# Patient Record
Sex: Female | Born: 1955
Health system: Southern US, Community
[De-identification: ages and names within clinical notes are randomized; demographics above are authoritative.]

## PROBLEM LIST (undated history)

## (undated) DIAGNOSIS — I5181 Takotsubo syndrome: Secondary | ICD-10-CM

## (undated) DIAGNOSIS — I2510 Atherosclerotic heart disease of native coronary artery without angina pectoris: Secondary | ICD-10-CM

## (undated) DIAGNOSIS — E785 Hyperlipidemia, unspecified: Secondary | ICD-10-CM

## (undated) DIAGNOSIS — I10 Essential (primary) hypertension: Secondary | ICD-10-CM

---

## 1999-08-10 ENCOUNTER — Other Ambulatory Visit: Admission: RE | Admit: 1999-08-10 | Discharge: 1999-08-10 | Payer: Self-pay | Admitting: Family Medicine

## 2001-04-13 ENCOUNTER — Other Ambulatory Visit: Admission: RE | Admit: 2001-04-13 | Discharge: 2001-04-13 | Payer: Self-pay | Admitting: Family Medicine

## 2001-10-19 ENCOUNTER — Encounter: Payer: Self-pay | Admitting: Family Medicine

## 2001-10-19 ENCOUNTER — Encounter: Admission: RE | Admit: 2001-10-19 | Discharge: 2001-10-19 | Payer: Self-pay | Admitting: Family Medicine

## 2002-08-27 ENCOUNTER — Other Ambulatory Visit: Admission: RE | Admit: 2002-08-27 | Discharge: 2002-08-27 | Payer: Self-pay | Admitting: Family Medicine

## 2003-10-14 ENCOUNTER — Encounter: Admission: RE | Admit: 2003-10-14 | Discharge: 2003-10-14 | Payer: Self-pay | Admitting: Family Medicine

## 2004-10-26 ENCOUNTER — Other Ambulatory Visit: Admission: RE | Admit: 2004-10-26 | Discharge: 2004-10-26 | Payer: Self-pay | Admitting: Family Medicine

## 2005-10-28 ENCOUNTER — Other Ambulatory Visit: Admission: RE | Admit: 2005-10-28 | Discharge: 2005-10-28 | Payer: Self-pay | Admitting: Family Medicine

## 2005-12-06 ENCOUNTER — Encounter: Admission: RE | Admit: 2005-12-06 | Discharge: 2005-12-06 | Payer: Self-pay | Admitting: Family Medicine

## 2006-12-27 ENCOUNTER — Other Ambulatory Visit: Admission: RE | Admit: 2006-12-27 | Discharge: 2006-12-27 | Payer: Self-pay | Admitting: Family Medicine

## 2007-01-02 ENCOUNTER — Encounter: Admission: RE | Admit: 2007-01-02 | Discharge: 2007-01-02 | Payer: Self-pay | Admitting: Family Medicine

## 2007-01-12 ENCOUNTER — Ambulatory Visit (HOSPITAL_COMMUNITY): Admission: RE | Admit: 2007-01-12 | Discharge: 2007-01-12 | Payer: Self-pay | Admitting: Neurology

## 2007-04-04 ENCOUNTER — Encounter: Admission: RE | Admit: 2007-04-04 | Discharge: 2007-04-04 | Payer: Self-pay | Admitting: Family Medicine

## 2008-04-23 ENCOUNTER — Other Ambulatory Visit: Admission: RE | Admit: 2008-04-23 | Discharge: 2008-04-23 | Payer: Self-pay | Admitting: Family Medicine

## 2008-05-20 ENCOUNTER — Encounter: Admission: RE | Admit: 2008-05-20 | Discharge: 2008-05-20 | Payer: Self-pay | Admitting: Family Medicine

## 2009-01-23 ENCOUNTER — Inpatient Hospital Stay (HOSPITAL_COMMUNITY): Admission: EM | Admit: 2009-01-23 | Discharge: 2009-01-24 | Payer: Self-pay | Admitting: Emergency Medicine

## 2009-02-05 ENCOUNTER — Emergency Department (HOSPITAL_COMMUNITY): Admission: EM | Admit: 2009-02-05 | Discharge: 2009-02-05 | Payer: Self-pay | Admitting: Emergency Medicine

## 2009-02-10 ENCOUNTER — Encounter: Admission: RE | Admit: 2009-02-10 | Discharge: 2009-02-10 | Payer: Self-pay | Admitting: Interventional Cardiology

## 2009-04-24 ENCOUNTER — Other Ambulatory Visit: Admission: RE | Admit: 2009-04-24 | Discharge: 2009-04-24 | Payer: Self-pay | Admitting: Family Medicine

## 2009-08-14 ENCOUNTER — Encounter: Admission: RE | Admit: 2009-08-14 | Discharge: 2009-08-14 | Payer: Self-pay | Admitting: Family Medicine

## 2009-12-11 ENCOUNTER — Encounter: Admission: RE | Admit: 2009-12-11 | Discharge: 2009-12-11 | Payer: Self-pay | Admitting: Gastroenterology

## 2010-09-07 ENCOUNTER — Other Ambulatory Visit: Admission: RE | Admit: 2010-09-07 | Discharge: 2010-09-07 | Payer: Self-pay | Admitting: Family Medicine

## 2010-12-02 ENCOUNTER — Encounter
Admission: RE | Admit: 2010-12-02 | Discharge: 2010-12-02 | Payer: Self-pay | Source: Home / Self Care | Attending: Family Medicine | Admitting: Family Medicine

## 2010-12-05 ENCOUNTER — Encounter: Payer: Self-pay | Admitting: Neurology

## 2011-02-24 LAB — COMPREHENSIVE METABOLIC PANEL
Creatinine, Ser: 0.72 mg/dL (ref 0.4–1.2)
GFR calc Af Amer: >60 mL/min (ref >60)
GFR calc non Af Amer: >60 mL/min (ref >60)
Glucose, Bld: 97 mg/dL (ref 70–99)
Potassium: 3.6 mEq/L (ref 3.5–5.1)
Total Protein: 7.6 g/dL (ref 6.0–8.3)

## 2011-02-24 LAB — URINALYSIS, ROUTINE W REFLEX MICROSCOPIC
Glucose, UA: NEGATIVE mg/dL
Protein, ur: NEGATIVE mg/dL
Specific Gravity, Urine: 1.018 (ref 1.005–1.030)
Urobilinogen, UA: 0.2 mg/dL (ref 0.0–1.0)
pH: 6 (ref 5.0–8.0)

## 2011-02-24 LAB — URINE CULTURE: Colony Count: 80000

## 2011-02-24 LAB — CBC
HCT: 41 % (ref 36.0–46.0)
Hemoglobin: 14.2 g/dL (ref 12.0–15.0)
MCHC: 33.9 g/dL (ref 30.0–36.0)
MCHC: 34.6 g/dL (ref 30.0–36.0)
Platelets: 297 10*3/uL (ref 150–400)
Platelets: 320 10*3/uL (ref 150–400)
RDW: 13.1 % (ref 11.5–15.5)
RDW: 13.5 % (ref 11.5–15.5)

## 2011-02-24 LAB — CK TOTAL AND CKMB (NOT AT ARMC)
CK, MB: 0.7 ng/mL (ref 0.3–4.0)
Relative Index: INVALID (ref 0.0–2.5)

## 2011-02-24 LAB — POCT CARDIAC MARKERS: CKMB, poc: <1 ng/mL — ABNORMAL LOW (ref 1.0–8.0)

## 2011-02-24 LAB — LIPID PANEL
Cholesterol: 176 mg/dL (ref 0–200)
LDL Cholesterol: 115 mg/dL — ABNORMAL HIGH (ref 0–99)

## 2011-02-24 LAB — CARDIAC PANEL(CRET KIN+CKTOT+MB+TROPI)
Relative Index: INVALID (ref 0.0–2.5)
Total CK: 40 U/L (ref 7–177)
Troponin I: <0.01 ng/mL (ref 0.00–0.06)

## 2011-02-24 LAB — DIFFERENTIAL
Basophils Absolute: 0.1 10*3/uL (ref 0.0–0.1)
Eosinophils Absolute: 0.2 10*3/uL (ref 0.0–0.7)
Eosinophils Relative: 2 % (ref 0–5)
Lymphs Abs: 2.4 10*3/uL (ref 0.7–4.0)
Neutro Abs: 5.7 10*3/uL (ref 1.7–7.7)

## 2011-02-24 LAB — HEMOGLOBIN A1C: Mean Plasma Glucose: 111 mg/dL

## 2011-02-24 LAB — TROPONIN I: Troponin I: <0.01 ng/mL (ref 0.00–0.06)

## 2011-02-24 LAB — MAGNESIUM: Magnesium: 2.2 mg/dL (ref 1.5–2.5)

## 2011-02-24 LAB — BASIC METABOLIC PANEL
BUN: 12 mg/dL (ref 6–23)
CO2: 26 mEq/L (ref 19–32)
Creatinine, Ser: 0.7 mg/dL (ref 0.4–1.2)

## 2011-02-24 LAB — PROTIME-INR: INR: 1 (ref 0.00–1.49)

## 2011-02-24 LAB — TSH: TSH: 2.554 u[IU]/mL (ref 0.350–4.500)

## 2011-03-29 NOTE — Op Note (Signed)
NAME:  Kayla [name], Kayla [name] NO.:  [id number]   MEDICAL RECORD NO.:  [id number]          PATIENT TYPE:  EMS   LOCATION:  MAJO                         FACILITY:  [facility]   PHYSICIAN:  Corky Crafts, MDDATE OF BIRTH:  [birth year]   DATE OF PROCEDURE:  02/05/2009  DATE OF DISCHARGE:  02/05/2009                               OPERATIVE REPORT   REFERRING PHYSICIAN:  Consuella Lose C. Valentina Lucks, MD   PROCEDURES PERFORMED:  Abdominal aortogram, bilateral selective renal  angiogram.   INDICATIONS:  Hypertension, shortness of breath, flank pain, and  abnormal renal Doppler study.   PROCEDURE NARRATIVE:  The risks and benefits of peripheral vascular  angiography were explained to the patient and informed consent was  obtained.  She was brought to the [facility] lab.  She was prepped and draped in  the usual sterile fashion.  Her right groin was infiltrated with 1%  lidocaine.  A 5-French sheath was placed into the right femoral artery  using the modified Seldinger technique.  A pigtail catheter was used for  a power injection of contrast in the abdominal aorta.  Subsequently, a  JR4 catheter was used to engage the renal artery selectively.  Contrast  was injected in several projections.  The sheath was removed using  manual compression.   FINDINGS:  1. Abdominal aortogram:  There are bilateral single renal arteries.      There is no abdominal aortic aneurysm.  There is no obvious      atherosclerosis of the aorta.  2. Selective renal angiography:  The left renal artery is a large      vessel with some tortuosity in the mid portion.  The main renal      artery and all its branches appear widely patent.  3. The right renal artery is a medium to large-sized vessel and also      appears widely patent.   IMPRESSION:  1. No significant renal artery stenosis.  2. No abdominal aortic aneurysm.   RECOMMENDATIONS:  We will continue blood pressure management with  medicines.  She did have a  questionable allergy or reaction to  LISINOPRIL.  It did not appear to be a true allergy; however, we will go  ahead and switch her to Norvasc 5 mg daily.  She will continue  hydrochlorothiazide.  I will follow up with her in the office.  We will  also consider obtaining an abdominal CT scan to evaluate her flank pain.      Corky Crafts, MD  Electronically Signed     JSV/MEDQ  D:  02/05/2009  T:  02/06/2009  Job:  [id number]

## 2011-03-29 NOTE — Discharge Summary (Signed)
NAME:  Kayla [name], Kayla [name] NO.:  [id number]   MEDICAL RECORD NO.:  [id number]          PATIENT TYPE:  INP   LOCATION:  3703                         FACILITY:  [facility]   PHYSICIAN:  Ramiro Harvest, MD    DATE OF BIRTH:  1955/12/27   DATE OF ADMISSION:  01/23/2009  DATE OF DISCHARGE:  01/24/2009                               DISCHARGE SUMMARY   PRIMARY CARE PHYSICIAN:  Dr. Maurice Small of [facility].   DISCHARGE DIAGNOSES:  1. Chest pain.  2. Hypertensive urgency.  3. Anxiety.  4. Irritable bowel syndrome.  5. Gastroesophageal reflux disease.  6. Benign multiple sclerosis.   DISCHARGE MEDICATIONS:  1. HCTZ 12.5 mg p.o. daily.  2. Lisinopril 5 mg p.o. daily.  3. Xanax 0.5 mg p.o. b.i.d. p.r.n.  4. Multivitamin 1 tablet daily.   DISPOSITION AND FOLLOWUP:  The patient will be discharged home.  The  patient to follow up with Dr. Eldridge Dace for outpatient stress test and  outpatient echo test for a further workup of her chest pain.  The  patient is to follow up with PCP in the next 1 - 2 weeks.  The patient  will need a BMET done in the next week to follow up on electrolytes and  renal function as patient is on both HCTZ and has been recently started  on lisinopril.   CONSULTATIONS DONE:  A cardiology consult was done.  The patient was  seen in consultation by Dr. Catalina Gravel on January 23, 2009.   PROCEDURES PERFORMED:  A chest x-ray was performed on January 23, 2009  that was normal chest x-ray.   BRIEF ADMISSION HISTORY AND PHYSICAL:  Kayla [name] is a 55 year old  white female, history of recently diagnosed hypertension, anxiety, IBS,  gastroesophageal reflux disease, benign multiple sclerosis per patient  with family history of coronary artery disease, presented to the ED from  her doctor's office with hypertensive urgency and chest pain.  Per the  patient she was at work on the day of admission, started to feel  lightheaded and dizzy.  Her coworker  took her downstairs to the on-site  clinic where blood pressure was noted to be 198/120.  At the time the  patient started to have some left substernal chest pain, left neck and  left upper extremity pain which she described as a dull pain which  lasted only a few seconds with some associated palpitations, nausea and  feelings of being flushed, intermittent shortness of breath, generalized  fatigue and blurry vision.  The patient denied any fever, no chills, no  diarrhea, no melena, no hematemesis, no hematochezia, no focal  neurological symptoms.  At the on-site clinic the patient was given some  nitroglycerin and aspirin and then sent to the emergency department.  The patient also stated that she had recently been started on HCTZ for  blood pressure 3 days prior to admission per her PCP.  EKG done in the  ED showed a right bundle branch block with some T-wave inversions in  leads III, aVF, V2 through V3.  The patient was  chest pain free at the  time of the interview.  Point of care cardiac markers obtained were  negative.  Comprehensive metabolic profile was within normal limits.  CBC was within normal limits.  Chest x-ray was negative.  We were called  to admit the patient.   PHYSICAL EXAM:  Temperature 98.4, blood pressure 153/92, pulse of 73,  respirations 20, saturating 99% on room air.  GENERAL:  The patient lying on gurney in no apparent distress.  HEENT: Normocephalic/atraumatic, pupils equal, round and reactive to  light and accommodation, extraocular movements intact, oropharynx was  clear, no lesions, no exudates.  NECK:  Supple, no lymphadenopathy.  RESPIRATORY:  Lungs were clear to auscultation bilaterally, no wheezes,  no crackles, no rhonchi.  CARDIOVASCULAR:  Regular rate and rhythm, no murmurs, rubs or gallops.  ABDOMEN:  Soft, nontender, nondistended, positive bowel sounds.  EXTREMITIES:  No clubbing, cyanosis or edema.  NEUROLOGICAL:  The patient was alert and  oriented x3.  Cranial nerves II  through XII were grossly intact.  No focal deficits.   ADMISSION LABS:  CBC:  White count 8.9, hemoglobin 14.2, platelets 320,  hematocrit 41, ANC of 5.7, PTT 30, PT 13, INR 1, sodium 138, potassium  3.6, chloride 101, bicarb 28, BUN 11, creatinine 0.72, glucose of 97,  calcium of 9, albumin of 3.6, protein of 7.6, albumin of 0.3, alk  phosphatase 104, AST 23, ALT 18.  Point of care cardiac markers:  CK-MB  less than 1, troponin-I less than 0.05, myoglobin 43.9.  EKG with a  right bundle branch block and T-wave inversion in leads III, aVF, V2  through V3.   HOSPITAL COURSE:  1. Chest pain.  The patient was admitted with chest pain, rule out      myocardial infarction.  Cardiac enzymes were cycled which came back      negative.  EKG had T-wave inversions in leads III, aVF, V3 and V4.      A TSH obtained was 2.554 which was within normal limits.      Urinalysis was negative.  A hemoglobin A1c was 5.5.  Fasting lipid      panel obtained had a total cholesterol of 176, triglycerides of 62,      HDL of 49, LDL of 115.  The patient was admitted to a tele      monitored bed.  The patient remained asymptomatic throughout the      hospitalization.  Cardiology was consulted secondary to the EKG      changes.  The patient was seen in consultation by Dr. Eldridge Dace of      [facility] on January 23, 2009.  It was felt the patient's      chest pain was likely secondary to her hypertensive urgency.  The      patient was placed on 10 mg of lisinopril as well as 25 mg of HCTZ.      The patient remained stable in stable condition, had no further      episodes of chest pain.  The patient ruled out by cardiac enzymes      and was felt that the patient could follow-up as an outpatient for      outpatient echocardiogram as well as an outpatient stress test per      cardiology.  The patient will have these followed up as an      outpatient with Dr. Eldridge Dace.  The patient  will be discharged in  stable condition.  2. Hypertensive urgency.  The patient was admitted and on admission      the patient was noted to have systolic blood pressures in the 190s.      By the time the patient had come into the emergency room she had      already received some nitroglycerin and aspirin.  The patient's      blood pressure remained in the 150s and then patient was then      placed on HCTZ 25 mg daily as well as 10 mg of lisinopril.  The      patient improved clinically and symptomatically with improvement in      the dizziness and lightheadedness.  The patient's blood pressure      was in the low 100s on the day of discharge, however, the patient      was in stable and improved condition.  The patient will be      discharged home on her home dose of HCTZ 12.5 mg daily as well as 5      mg of lisinopril daily.  The patient is to follow up with primary      care physician in the next 1 - 2 weeks for further assessment of      her blood pressure.  The patient will need a basic metabolic      profile done in 1 week to follow up on electrolytes and renal      function.  The patient had stated that she could get these labs      done at work and sent to her primary care physician.   The rest of the patient's chronic medical issues remained stable  throughout the hospitalization and patient will be discharged in stable  and improved condition.   DISCHARGE VITAL SIGNS:  Temperature 98.1, blood pressure 102/76, pulse  of 67, respirations 16, saturating 96% on room air.   DISCHARGE LABS:  Sodium 136, potassium 3.5, chloride 100, bicarb 26, BUN  12, creatinine 0.7, glucose of 93, calcium of 8.7.  CBC:  White count  6.9, hemoglobin 13.2, platelets 297, hematocrit 39.2, TSH of 2.554.   It was a pleasure taking care of Ms. Daralyn Meckley.      Ramiro Harvest, MD  Electronically Signed     DT/MEDQ  D:  01/24/2009  T:  01/24/2009  Job:  [id number]   cc:   Gretta Arab. Valentina Lucks,  M.D.  Corky Crafts, MD

## 2011-03-29 NOTE — H&P (Signed)
NAME:  Kayla [name], Kayla [name] NO.:  [id number]   MEDICAL RECORD NO.:  [id number]          PATIENT TYPE:  EMS   LOCATION:  MAJO                         FACILITY:  [facility]   PHYSICIAN:  Ramiro Harvest, MD    DATE OF BIRTH:  06/23/1956   DATE OF ADMISSION:  01/23/2009  DATE OF DISCHARGE:                              HISTORY & PHYSICAL   PRIMARY CARE PHYSICIAN:  Dr. Maurice Small of [facility].   HISTORY OF PRESENT ILLNESS:  Kayla [name] is a 52-year white female with  a history of recently diagnosed hypertension, anxiety, IBS, GERD, benign  multiple sclerosis per the patient, and family history of coronary  artery disease who presented to the ED from her doctor's office with  hypertensive urgency and chest pain.  Per the patient, she was at work  and on the day of admission, she started to feel lightheaded and dizzy.  Her  coworker took her downstairs to the on site clinic where her blood  pressure was noted to be 198/120.  The patient at that time was also  started to have some left substernal chest pain, left neck and left  upper extremity pain which she described as a dull pain which only  lasted a few seconds and some associated palpitations, nausea and  feelings of being flushed, intermittent shortness of breath, generalized  fatigue, and blurry vision.  The patient denied any fever.  No chills.  No diarrhea, no melena or hematemesis, no hematochezia.  No focal  neurological symptoms.  At the on site clinic, the patient was given  some nitroglycerin and aspirin and then sent to the ED.  The patient  also stated that she was recently started on HCTZ for her blood pressure  3 days prior to admission per her PCP.  EKG done in the ED showed a  right bundle branch block with some T-wave inversions in leads III, aVF,  V2-V3.  The patient is currently chest pain free.  Point of care cardiac  markers obtained were negative.  Comprehensive metabolic profile was  within  normal limits.  CBC within normal limits.  Chest x-ray was  negative.  We were called to admit the patient.   ALLERGIES:  Percodan causes emesis.   PAST MEDICAL HISTORY:  1. Hypertension.  2. Anxiety.  3. Irritable bowel syndrome.  4. Gastroesophageal reflux disease.  5. Benign multiple sclerosis.   HOME MEDICATIONS:  HCTZ 12.5 mg p.o. daily, Xanax 0.5 mg p.o. b.i.d.  p.r.n., multivitamin daily.   SOCIAL HISTORY:  The patient is married.  No tobacco use.  No alcohol  use.  No IV drug use.   FAMILY HISTORY:  Father alive at age 62, has a history of coronary  artery disease status post CABG and a history of hypertension.  Mother  deceased at age 50 from hepatitis C/cirrhosis and also had a history of  CVA.  The patient has one twin sister who is alive with hypertension.   REVIEW OF SYSTEMS:  Positive for constipation.  Also per HPI.  Otherwise  negative.   PHYSICAL EXAMINATION:  Temperature 98.4, blood pressure 153/92, pulse of  73, respirations 20, satting 99% on room air.  GENERAL:  Patient lying on gurney in no apparent distress.  HEENT:  Normocephalic, atraumatic.  Pupils equal, round and reactive to  light and accommodation.  Extraocular movements intact.  Oropharynx is  clear.  No lesions, no exudates.  NECK:  Supple.  No lymphadenopathy.  RESPIRATORY:  Lungs are clear to auscultation bilaterally.  No wheezes,  no crackles, no rhonchi.  CARDIOVASCULAR:  Regular rate and rhythm.  No murmurs, rubs or gallops.  ABDOMEN:  Soft, nontender, nondistended.  Positive bowel sounds.  EXTREMITIES:  No clubbing, cyanosis or edema.  NEUROLOGICAL:  The patient is alert and oriented x3.  Cranial Nerves II-  XII are grossly intact.  No focal deficits.   ADMISSION LABS:  CBC:  White count 8.9, hemoglobin 14.2, platelets 320,  hematocrit 41.0, ANC of 5.7.  PTT 30, PT 13.0, INR 1.0.  Sodium 138,  potassium 3.6, chloride 101, bicarb 28, BUN 11, creatinine 0.72, glucose  of 97, calcium of  9.2, albumin 3.6, protein 7.6, bilirubin 0.3, alk  phosphatase 104, AST 23, ALT 18.  Point of care cardiac markers:  CK-MB  less than 1.0, troponin I less than 0.05, myoglobin  43.9.  EKG with a  right bundle branch block, T-wave inversion in leads III, aVF, V2-V3.   ASSESSMENT AND PLAN:  Mr. Kayla [name] is a 55 year old female with a  history of recently diagnosed hypertension, anxiety, and  gastroesophageal reflux disease who presents to the ED with hypertensive  urgency, as well as chest pain.  1. Chest pain.  Differential includes acute coronary syndrome versus      GI versus musculoskeletal versus anxiety.  Admit the patient to      telemetry.  The patient does have a family history of coronary      artery disease and also history of hypertension.  EKG with a right      bundle branch block.  No old EKG to compare.  Cycle cardiac enzymes      q.8 h. x3.  Check a TSH.  Check a magnesium.  Check a UA.  Check a      hemoglobin A1c.  Check a BNP.  Check a fasting lipid panel.  Check      a 2-D echo to rule out LV dysfunction.  If cardiac enzymes are      positive, will consult cardiology.  Will place on aspirin, oxygen      and morphine sulfate and blood pressure medications of Norvasc and      HCTZ.  2. Hypertensive urgency secondary to uncontrolled blood pressure.      Will cycle cardiac enzymes.  See problem #1.  Will place on HCTZ 25      mg daily, Norvasc 10 mg daily, and hydralazine as needed.  3. Anxiety.  Ativan as needed.  4. Irritable bowel syndrome.  5. Gastroesophageal reflux disease.  Protonix.  6. Prophylaxis.  Protonix for GI prophylaxis.  Lovenox for DVT      prophylaxis.   It has been a pleasure taking care of [name].      Ramiro Harvest, MD  Electronically Signed     DT/MEDQ  D:  01/23/2009  T:  01/23/2009  Job:  [id number]   cc:   Gretta Arab. Valentina Lucks, M.D.

## 2011-03-29 NOTE — Consult Note (Signed)
NAME:  Kayla [name], Kayla [name] NO.:  [id number]   MEDICAL RECORD NO.:  [id number]          PATIENT TYPE:  INP   LOCATION:  3703                         FACILITY:  [facility]   PHYSICIAN:  Corky Crafts, MDDATE OF BIRTH:  May 19, 1956   DATE OF CONSULTATION:  DATE OF DISCHARGE:                                 CONSULTATION   PRIMARY CARE PHYSICIAN:  Gretta Arab. Valentina Lucks, MD   REFERRING PHYSICIAN:  Ramiro Harvest, MD   CHIEF COMPLAINT:  High blood pressure, chest pain.   HISTORY OF PRESENT ILLNESS:  The patient is a 55 year old woman who was  not feeling well last week.  She had her blood pressure checked at work  and it was in the 160s/90s.  She was given Xanax and told to see her  primary doctor.  She was taking no blood pressure medicines at that  time.  She saw her primary doctor and was given hydrochlorothiazide 12.5  mg a day.  She took this for a few days, but again today felt  lightheaded.  She had her blood pressure checked at work and it was  190/120.  She was sent to the hospital.  While she was waiting for the  ambulance, she had a very mild chest pain that she describes as being on  the surface of her chest, lasting only 1-2 seconds.  There was also a  quick pain in her left arm.  After getting in the ambulance, she  developed the pain in her neck which seems to have persisted.  She was  given nitroglycerin in the ambulance and this seemed to make her feel  somewhat better and did bring her blood pressure down.  Since being on  hydrochlorothiazide, she also reports an occasional sharp left flank  pain.  Since leaving work, she has not had any further chest pain.   PAST MEDICAL HISTORY:  1. Hypertension for 1 week.  2. GERD.   PAST SURGICAL HISTORY:  C-section x2.   MEDICATIONS:  Hydrochlorothiazide, Xanax, estrogen/progesterone cranium,  Claritin p.r.n.   Allergies to Percodan.   SOCIAL HISTORY:  She does not smoke or drink.  She drinks 2 cups of tea  daily.  She works at [employer].  She is married with 2 children.   FAMILY HISTORY:  Significant for father who had bypass surgery at age  62.  Mother died at age 67 with GI issues.  Her sister has high blood  pressure and developed it 1 year ago, sister is a twin sister.  There is  no early coronary artery disease in the family.   REVIEW OF SYSTEMS:  Significant for the above symptoms of high blood  pressure, dizziness, chest pain.  No nausea or vomiting.  No bleeding  problems.  No focal weakness.  No rash.  All other systems negative.   PHYSICAL EXAMINATION:  VITAL SIGNS:  Current blood pressure is 128/85,  pulse of 86.  GENERAL:  She is awake, alert, no apparent distress.  HEAD:  Normocephalic, atraumatic.  Eyes, extraocular movements intact.  NECK:  No JVD.  No carotid bruits.  CARDIOVASCULAR:  Regular rate and rhythm.  S1 and S2.  No murmurs, rubs,  or gallops.  LUNGS:  Clear to auscultation bilaterally.  ABDOMEN:  Soft, nontender, nondistended.  No bruit.  EXTREMITIES:  No edema.  NEUROLOGIC:  No focal deficits.  SKIN:  No rash.  BACK:  No kyphosis.  PSYCHIATRIC:  Normal mood and affect.   EKG shows normal sinus rhythm with a right bundle-branch block.  There  are flat ST-T waves laterally.  There are T-wave inversions inferiorly  in the anterior precordium.  BNP less than 30.  Troponin negative.  Creatinine 0.72.  Hemoglobin of 14.2.  LFTs normal.   ASSESSMENT AND PLAN:  1. Cardiac.  I think her symptoms are all related to the hypertension,      especially the dizziness.  The chest pain is quite atypical.  We      would add lisinopril 10 mg daily for better blood pressure control.      If she rules out and her blood pressure is controlled, I will      consider performing outpatient stress test and also consider a      renal duplex as an outpatient to evaluate for renal artery stenosis      given the sudden onset of this high blood pressure  2. Check cholesterol as part of  routine lipid screening.  3. Gastroesophageal reflux disease.  She had been on Protonix in the      past, but no longer needs this medicine.  What she had today does      not really sound like indigestion.  4. I will follow her while she is in the hospital.      Corky Crafts, MD  Electronically Signed     JSV/MEDQ  D:  01/23/2009  T:  01/24/2009  Job:  [id number]

## 2011-12-16 ENCOUNTER — Other Ambulatory Visit: Payer: Self-pay | Admitting: Family Medicine

## 2011-12-16 DIAGNOSIS — Z1231 Encounter for screening mammogram for malignant neoplasm of breast: Secondary | ICD-10-CM

## 2011-12-28 ENCOUNTER — Ambulatory Visit
Admission: RE | Admit: 2011-12-28 | Discharge: 2011-12-28 | Disposition: A | Discharge status: Home / Self Care | Payer: BC Managed Care – PPO | Source: Ambulatory Visit | Attending: Family Medicine | Admitting: Family Medicine

## 2011-12-28 DIAGNOSIS — Z1231 Encounter for screening mammogram for malignant neoplasm of breast: Secondary | ICD-10-CM

## 2012-01-03 ENCOUNTER — Other Ambulatory Visit: Payer: Self-pay | Admitting: Family Medicine

## 2012-01-03 DIAGNOSIS — R928 Other abnormal and inconclusive findings on diagnostic imaging of breast: Secondary | ICD-10-CM

## 2012-01-10 ENCOUNTER — Ambulatory Visit
Admission: RE | Admit: 2012-01-10 | Discharge: 2012-01-10 | Disposition: A | Discharge status: Home / Self Care | Payer: BC Managed Care – PPO | Source: Ambulatory Visit | Attending: Family Medicine | Admitting: Family Medicine

## 2012-01-10 DIAGNOSIS — R928 Other abnormal and inconclusive findings on diagnostic imaging of breast: Secondary | ICD-10-CM

## 2013-07-12 ENCOUNTER — Ambulatory Visit
Admission: RE | Admit: 2013-07-12 | Discharge: 2013-07-12 | Disposition: A | Discharge status: Home / Self Care | Payer: BC Managed Care – PPO | Source: Ambulatory Visit | Attending: Emergency Medicine | Admitting: Emergency Medicine

## 2013-07-12 ENCOUNTER — Other Ambulatory Visit: Payer: Self-pay | Admitting: Emergency Medicine

## 2013-07-12 DIAGNOSIS — R519 Headache, unspecified: Secondary | ICD-10-CM

## 2013-07-23 ENCOUNTER — Other Ambulatory Visit (HOSPITAL_COMMUNITY)
Admission: RE | Admit: 2013-07-23 | Discharge: 2013-07-23 | Disposition: A | Discharge status: Home / Self Care | Payer: BC Managed Care – PPO | Source: Ambulatory Visit | Attending: Family Medicine | Admitting: Family Medicine

## 2013-07-23 ENCOUNTER — Other Ambulatory Visit: Payer: Self-pay | Admitting: Family Medicine

## 2013-07-23 DIAGNOSIS — Z124 Encounter for screening for malignant neoplasm of cervix: Secondary | ICD-10-CM | POA: Insufficient documentation

## 2014-11-25 ENCOUNTER — Telehealth: Payer: Self-pay | Admitting: *Deleted

## 2014-11-25 NOTE — Telephone Encounter (Signed)
spoke with becky, requested all records.Marland KitchenMarland KitchenMarland Kitchen

## 2014-11-27 ENCOUNTER — Encounter: Payer: Self-pay | Admitting: Interventional Cardiology

## 2014-11-27 ENCOUNTER — Ambulatory Visit (INDEPENDENT_AMBULATORY_CARE_PROVIDER_SITE_OTHER): Payer: BLUE CROSS/BLUE SHIELD | Admitting: Interventional Cardiology

## 2014-11-27 VITALS — BP 130/88 | HR 69 | Ht 60.0 in | Wt 125.0 lb

## 2014-11-27 DIAGNOSIS — I10 Essential (primary) hypertension: Secondary | ICD-10-CM | POA: Insufficient documentation

## 2014-11-27 MED ORDER — METOPROLOL TARTRATE 25 MG PO TABS: 12.5000 mg | ORAL_TABLET | Freq: Two times a day (BID) | ORAL | Status: DC | PRN

## 2014-11-27 NOTE — Patient Instructions (Addendum)
Your physician has recommended you make the following change in your medication:   1) START TAKING METOPROLOL 12.5MG  TWICE DAILY AS NEEDED FOR SYSTOLIC BLOOD PRESSURE GREATER THAN 150   Your physician wants you to follow-up in: 1 YEAR WITH DR. VARANASI. You will receive a reminder letter in the mail two months in advance. If you don't receive a letter, please call our office to schedule the follow-up appointment.

## 2014-11-27 NOTE — Progress Notes (Signed)
Patient ID: Kayla [name], female   DOB: 05/26/1956, 59 y.o.   MRN: [id number]     Patient ID: Kayla [name] MRN: [id number] DOB/AGE: [birth year] 59 y.o.   Referring Physician Dr. Maurice Small   Reason for Consultation BP fluctuation  HPI: 59 y/o who has had HTN and difficulty tolerating several medicines in the past.  I last saw her in 2010. She was worked up for renal artery stenosis at the time. She has had persistent dizziness which is worse in the past few months while she stands.  She also has Multiple sclerosis.   There are occasions when she can have dizziness while sitting as well.  She has not fallen or passed out at any time.  She feels tired.  She does Zumba once a week and walks every other day.  She can keep up well with her Zumba class.   No chest pain or SHOB.  Occasional palpitations, worse when walking in the cold weather. She does not drink coffee due to coffee giving her headaches.  She will eat chocolate, but drinks decaf tea.   Current Outpatient Prescriptions  Medication Sig Dispense Refill  . metoprolol tartrate (LOPRESSOR) 25 MG tablet Take 0.5 tablets (12.5 mg total) by mouth 2 (two) times daily as needed. For systolic BP greater than 150. 30 tablet 6   No current facility-administered medications for this visit.   No past medical history on file.  Family History  Problem Relation Age of Onset  . Heart disease Father     History   Social History  . Marital Status: Married    Spouse Name: N/A    Number of Children: N/A  . Years of Education: N/A   Occupational History  . Not on file.   Social History Main Topics  . Smoking status: Never Smoker   . Smokeless tobacco: Not on file  . Alcohol Use: Not on file  . Drug Use: Not on file  . Sexual Activity: Not on file   Other Topics Concern  . Not on file   Social History Narrative  . No narrative on file    No past surgical history on file.    (Not in a hospital admission)  Review of  systems complete and found to be negative unless listed above .  No nausea, vomiting.  No fever chills, No focal weakness,  No palpitations.  Physical Exam: Filed Vitals:   11/27/14 1441  BP: 130/88  Pulse: 69    Weight: 125 lb (56.7 kg)  Physical exam:  [place]/AT EOMI No JVD, No carotid bruit RRR S1S2  No wheezing Soft. NT, nondistended No edema. No focal motor or sensory deficits Normal affect  Labs:   Lab Results  Component Value Date   WBC 6.9 01/24/2009   HGB 13.2 01/24/2009   HCT 39.0 01/24/2009   MCV 95.3 01/24/2009   PLT 297 01/24/2009   No results for input(s): NA, K, CL, CO2, BUN, CREATININE, CALCIUM, PROT, BILITOT, ALKPHOS, ALT, AST, GLUCOSE in the last 168 hours.  Invalid input(s): LABALBU Lab Results  Component Value Date   CKTOTAL 40 01/24/2009   CKMB 0.5 01/24/2009   TROPONINI <0.01        NO INDICATION OF MYOCARDIAL INJURY. 01/24/2009    Lab Results  Component Value Date   CHOL  01/24/2009    176        ATP III CLASSIFICATION:  <200     mg/dL   Desirable  771-165  mg/dL   Borderline High  >=161    mg/dL   High          Lab Results  Component Value Date   HDL 49 01/24/2009   Lab Results  Component Value Date   LDLCALC * 01/24/2009    115        Total Cholesterol/HDL:CHD Risk Coronary Heart Disease Risk Table                     Men   Women  1/2 Average Risk   3.4   3.3  Average Risk       5.0   4.4  2 X Average Risk   9.6   7.1  3 X Average Risk  23.4   11.0        Use the calculated Patient Ratio above and the CHD Risk Table to determine the patient's CHD Risk.        ATP III CLASSIFICATION (LDL):  <100     mg/dL   Optimal  096-045  mg/dL   Near or Above                    Optimal  130-159  mg/dL   Borderline  409-811  mg/dL   High  >914     mg/dL   Very High   Lab Results  Component Value Date   TRIG 62 01/24/2009   Lab Results  Component Value Date   CHOLHDL 3.6 01/24/2009   No results found for: LDLDIRECT      EKG:NSR, NSST  ASSESSMENT AND PLAN:  1) Episodic hypertension:  She has been on several medications in the past including lisinopril, Micardis and she thinks amlodipine. She had intolerances to several of these medicines. Overall, her blood pressures are well controlled. She has occasional spikes in her blood pressure. The highest reading was 180/100. In the last month, she has not had any further spikes. She does not want to take any daily medicine if possible. This is reasonable since the spikes are quite rare. We will prescribe metoprolol 12.5 mg by mouth twice a day when necessary systolic greater than 160. She has been very sensitive to medicines in the past. She has chronic dizziness. Hopefully, she will not have to use metoprolol very frequently. If she has more blood pressure spikes, she will let us know. Signed:   Fredric Mare, MD, [facility] 11/27/2014, 4:07 PM

## 2014-11-28 NOTE — Telephone Encounter (Signed)
Not sure if records came, i was put back on the floor and chart prep said they had not seen the records.Marland KitchenMarland Kitchen

## 2015-07-10 ENCOUNTER — Other Ambulatory Visit: Payer: Self-pay

## 2015-07-10 DIAGNOSIS — Z1231 Encounter for screening mammogram for malignant neoplasm of breast: Secondary | ICD-10-CM

## 2015-07-13 ENCOUNTER — Ambulatory Visit
Admission: RE | Admit: 2015-07-13 | Discharge: 2015-07-13 | Disposition: A | Discharge status: Home / Self Care | Payer: BLUE CROSS/BLUE SHIELD | Source: Ambulatory Visit

## 2015-07-13 DIAGNOSIS — Z1231 Encounter for screening mammogram for malignant neoplasm of breast: Secondary | ICD-10-CM

## 2015-12-04 ENCOUNTER — Other Ambulatory Visit: Payer: Self-pay | Admitting: Family Medicine

## 2015-12-04 DIAGNOSIS — N950 Postmenopausal bleeding: Secondary | ICD-10-CM

## 2015-12-11 ENCOUNTER — Ambulatory Visit
Admission: RE | Admit: 2015-12-11 | Discharge: 2015-12-11 | Disposition: A | Discharge status: Home / Self Care | Payer: BLUE CROSS/BLUE SHIELD | Source: Ambulatory Visit | Attending: Family Medicine | Admitting: Family Medicine

## 2015-12-11 DIAGNOSIS — N950 Postmenopausal bleeding: Secondary | ICD-10-CM

## 2017-02-24 DIAGNOSIS — Z86010 Personal history of colonic polyps: Secondary | ICD-10-CM | POA: Diagnosis not present

## 2017-02-24 DIAGNOSIS — K640 First degree hemorrhoids: Secondary | ICD-10-CM | POA: Diagnosis not present

## 2018-05-14 DIAGNOSIS — R5383 Other fatigue: Secondary | ICD-10-CM | POA: Diagnosis not present

## 2018-08-24 DIAGNOSIS — Z0000 Encounter for general adult medical examination without abnormal findings: Secondary | ICD-10-CM | POA: Diagnosis not present

## 2018-10-02 ENCOUNTER — Other Ambulatory Visit (HOSPITAL_COMMUNITY)
Admission: RE | Admit: 2018-10-02 | Discharge: 2018-10-02 | Disposition: A | Discharge status: Home / Self Care | Payer: BLUE CROSS/BLUE SHIELD | Source: Ambulatory Visit | Attending: Family Medicine | Admitting: Family Medicine

## 2018-10-02 ENCOUNTER — Other Ambulatory Visit: Payer: Self-pay | Admitting: Physician Assistant

## 2018-10-02 DIAGNOSIS — G35 Multiple sclerosis: Secondary | ICD-10-CM | POA: Diagnosis not present

## 2018-10-02 DIAGNOSIS — Z124 Encounter for screening for malignant neoplasm of cervix: Secondary | ICD-10-CM | POA: Diagnosis not present

## 2018-10-02 DIAGNOSIS — Z01419 Encounter for gynecological examination (general) (routine) without abnormal findings: Secondary | ICD-10-CM | POA: Diagnosis not present

## 2018-10-02 DIAGNOSIS — K219 Gastro-esophageal reflux disease without esophagitis: Secondary | ICD-10-CM | POA: Diagnosis not present

## 2018-10-02 DIAGNOSIS — Z0000 Encounter for general adult medical examination without abnormal findings: Secondary | ICD-10-CM | POA: Diagnosis not present

## 2018-10-02 DIAGNOSIS — I10 Essential (primary) hypertension: Secondary | ICD-10-CM | POA: Diagnosis not present

## 2018-10-04 LAB — CYTOLOGY - PAP: DIAGNOSIS: NEGATIVE

## 2020-02-23 ENCOUNTER — Inpatient Hospital Stay (HOSPITAL_COMMUNITY)
Admission: EM | Admit: 2020-02-23 | Discharge: 2020-02-25 | DRG: 281 | Disposition: A | Discharge status: Home / Self Care | Payer: BC Managed Care – PPO | Attending: Internal Medicine | Admitting: Internal Medicine

## 2020-02-23 ENCOUNTER — Emergency Department (HOSPITAL_COMMUNITY): Payer: BC Managed Care – PPO

## 2020-02-23 ENCOUNTER — Encounter (HOSPITAL_COMMUNITY): Payer: Self-pay | Admitting: Emergency Medicine

## 2020-02-23 ENCOUNTER — Other Ambulatory Visit: Payer: Self-pay

## 2020-02-23 DIAGNOSIS — Z888 Allergy status to other drugs, medicaments and biological substances status: Secondary | ICD-10-CM

## 2020-02-23 DIAGNOSIS — G4489 Other headache syndrome: Secondary | ICD-10-CM | POA: Diagnosis not present

## 2020-02-23 DIAGNOSIS — Z20822 Contact with and (suspected) exposure to covid-19: Secondary | ICD-10-CM | POA: Diagnosis present

## 2020-02-23 DIAGNOSIS — I2510 Atherosclerotic heart disease of native coronary artery without angina pectoris: Secondary | ICD-10-CM | POA: Diagnosis not present

## 2020-02-23 DIAGNOSIS — I509 Heart failure, unspecified: Secondary | ICD-10-CM | POA: Diagnosis not present

## 2020-02-23 DIAGNOSIS — I214 Non-ST elevation (NSTEMI) myocardial infarction: Principal | ICD-10-CM | POA: Diagnosis present

## 2020-02-23 DIAGNOSIS — R52 Pain, unspecified: Secondary | ICD-10-CM | POA: Diagnosis not present

## 2020-02-23 DIAGNOSIS — Z8249 Family history of ischemic heart disease and other diseases of the circulatory system: Secondary | ICD-10-CM

## 2020-02-23 DIAGNOSIS — Z885 Allergy status to narcotic agent status: Secondary | ICD-10-CM

## 2020-02-23 DIAGNOSIS — I4510 Unspecified right bundle-branch block: Secondary | ICD-10-CM | POA: Diagnosis present

## 2020-02-23 DIAGNOSIS — I110 Hypertensive heart disease with heart failure: Secondary | ICD-10-CM | POA: Diagnosis not present

## 2020-02-23 DIAGNOSIS — I252 Old myocardial infarction: Secondary | ICD-10-CM | POA: Diagnosis present

## 2020-02-23 DIAGNOSIS — I25110 Atherosclerotic heart disease of native coronary artery with unstable angina pectoris: Secondary | ICD-10-CM | POA: Diagnosis present

## 2020-02-23 DIAGNOSIS — I5181 Takotsubo syndrome: Secondary | ICD-10-CM | POA: Diagnosis present

## 2020-02-23 DIAGNOSIS — R7989 Other specified abnormal findings of blood chemistry: Secondary | ICD-10-CM | POA: Diagnosis not present

## 2020-02-23 DIAGNOSIS — I6521 Occlusion and stenosis of right carotid artery: Secondary | ICD-10-CM | POA: Diagnosis not present

## 2020-02-23 DIAGNOSIS — E785 Hyperlipidemia, unspecified: Secondary | ICD-10-CM | POA: Diagnosis present

## 2020-02-23 DIAGNOSIS — I10 Essential (primary) hypertension: Secondary | ICD-10-CM | POA: Diagnosis not present

## 2020-02-23 DIAGNOSIS — R0602 Shortness of breath: Secondary | ICD-10-CM | POA: Diagnosis not present

## 2020-02-23 DIAGNOSIS — I519 Heart disease, unspecified: Secondary | ICD-10-CM | POA: Diagnosis not present

## 2020-02-23 HISTORY — DX: Essential (primary) hypertension: I10

## 2020-02-23 HISTORY — DX: Atherosclerotic heart disease of native coronary artery without angina pectoris: I25.10

## 2020-02-23 HISTORY — DX: Takotsubo syndrome: I51.81

## 2020-02-23 HISTORY — DX: Hyperlipidemia, unspecified: E78.5

## 2020-02-23 LAB — TROPONIN I (HIGH SENSITIVITY): Troponin I (High Sensitivity): 372 ng/L (ref <18)

## 2020-02-23 LAB — CBC WITH DIFFERENTIAL/PLATELET
Abs Immature Granulocytes: 0.02 10*3/uL (ref 0.00–0.07)
Basophils Absolute: 0 10*3/uL (ref 0.0–0.1)
Basophils Relative: 1 %
Eosinophils Absolute: 0.3 10*3/uL (ref 0.0–0.5)
Eosinophils Relative: 3 %
HCT: 41.9 % (ref 36.0–46.0)
Hemoglobin: 13.9 g/dL (ref 12.0–15.0)
Immature Granulocytes: 0 %
Lymphocytes Relative: 35 %
Lymphs Abs: 3.1 10*3/uL (ref 0.7–4.0)
MCH: 32.6 pg (ref 26.0–34.0)
MCHC: 33.2 g/dL (ref 30.0–36.0)
MCV: 98.1 fL (ref 80.0–100.0)
Monocytes Absolute: 0.6 10*3/uL (ref 0.1–1.0)
Monocytes Relative: 7 %
Neutro Abs: 4.7 10*3/uL (ref 1.7–7.7)
Neutrophils Relative %: 54 %
Platelets: 351 10*3/uL (ref 150–400)
RBC: 4.27 MIL/uL (ref 3.87–5.11)
RDW: 13.1 % (ref 11.5–15.5)
WBC: 8.8 10*3/uL (ref 4.0–10.5)
nRBC: 0 % (ref 0.0–0.2)

## 2020-02-23 LAB — COMPREHENSIVE METABOLIC PANEL
ALT: 13 U/L (ref 0–44)
AST: 21 U/L (ref 15–41)
Albumin: 3.4 g/dL — ABNORMAL LOW (ref 3.5–5.0)
Alkaline Phosphatase: 71 U/L (ref 38–126)
Anion gap: 10 (ref 5–15)
BUN: 11 mg/dL (ref 8–23)
CO2: 22 mmol/L (ref 22–32)
Calcium: 8.3 mg/dL — ABNORMAL LOW (ref 8.9–10.3)
Chloride: 103 mmol/L (ref 98–111)
Creatinine, Ser: 0.86 mg/dL (ref 0.44–1.00)
GFR calc Af Amer: >60 mL/min (ref >60)
GFR calc non Af Amer: >60 mL/min (ref >60)
Glucose, Bld: 123 mg/dL — ABNORMAL HIGH (ref 70–99)
Potassium: 4 mmol/L (ref 3.5–5.1)
Sodium: 135 mmol/L (ref 135–145)
Total Bilirubin: 0.4 mg/dL (ref 0.3–1.2)
Total Protein: 7.1 g/dL (ref 6.5–8.1)

## 2020-02-23 LAB — D-DIMER, QUANTITATIVE: D-Dimer, Quant: 0.44 ug/mL-FEU (ref 0.00–0.50)

## 2020-02-23 MED ORDER — HEPARIN BOLUS VIA INFUSION
3500.0000 [IU] | Freq: Once | INTRAVENOUS | Status: AC
  Administered 2020-02-23: 3500 [IU] via INTRAVENOUS
  Filled 2020-02-23: qty 3500

## 2020-02-23 MED ORDER — ASPIRIN 81 MG PO CHEW
243.0000 mg | CHEWABLE_TABLET | Freq: Once | ORAL | Status: AC
  Administered 2020-02-23: 243 mg via ORAL
  Filled 2020-02-23: qty 3

## 2020-02-23 MED ORDER — HEPARIN (PORCINE) 25000 UT/250ML-% IV SOLN
850.0000 [IU]/h | INTRAVENOUS | Status: DC
  Administered 2020-02-23: 700 [IU]/h via INTRAVENOUS
  Filled 2020-02-23: qty 250

## 2020-02-23 MED ORDER — ASPIRIN 81 MG PO CHEW: 324.0000 mg | CHEWABLE_TABLET | Freq: Once | ORAL | Status: DC

## 2020-02-23 NOTE — H&P (Signed)
Cardiology Admission History and Physical:   Patient ID: [name] MRN: [id number]; DOB: Jan 27, 1956   Admission date: 02/23/2020  Primary Care Provider: No primary care provider on file. Primary Cardiologist: No primary care provider on file.  Primary Electrophysiologist:  None   Chief Complaint:  Jaw pain, dizziness  Patient Profile:   Kayla [name] is a 64 y.o. female with history of HTN presents with jaw pain.  History of Present Illness:   [name] reports symptoms of jaw pain beginning earlier today. She states that for the past few days she has had shortness of breath with exertion, which is a new symptom for her. She walks about 30 minutes daily and has had increasing shortness of breath during this time lately. Earlier today she describes symptoms of neck, jaw, and head pain that she has never had before. She additionally describes shortness of breath similar to above. She denies any syncope, presyncope, chest pain, chest pressure, nausea, vomiting, loss of vision, change in vision, blurry vision, motor weakness, loss of sensation, leg swelling, weight loss/gain, orthopnea, PND. She does report symptoms of diaphoresis and dizziness. She took aspirin earlier today with some improvement in her neck and head pain. Her symptoms however did not resolve, thus she called EMS and was brought to the ED for evaluation.   On arrival at the ED, the patient continues to report the above symptoms. Her vital signs were significant for systolic blood pressure in the 170s, otherwise the remainder of her vitals were within normal limits. Her laboratory testing was significant for an initial troponin of 372. Her ECG was significant only for RBBB but no acute ST or T wave changes. She was given aspirin and started on a heparin drip. Cardiology was called for consultation.   Currently the patient reports mild symptoms of neck and jaw pain and headache. Her symptoms have slightly improved compared to  earlier in the day.   The patient has a parent who has a history of CAD and underwent CABG later in life. No other significant family history of cardiovascular disease. She is a never smoker. She does not drink alcohol or use illicit drugs.   Heart Pathway Score:     Past Medical History:  Diagnosis Date  . Hypertension        Medications Prior to Admission: Prior to Admission medications   Medication Sig Start Date End Date Taking? Authorizing Provider  metoprolol tartrate (LOPRESSOR) 25 MG tablet Take 0.5 tablets (12.5 mg total) by mouth 2 (two) times daily as needed. For systolic BP greater than 150. 5/53/74   Corky Crafts, MD     Allergies:    Allergies  Allergen Reactions  . Percodan [Oxycodone-Aspirin] Nausea And Vomiting  . Lisinopril Other (See Comments)    unkown    Social History:   Social History   Socioeconomic History  . Marital status: Married    Spouse name: Not on file  . Number of children: Not on file  . Years of education: Not on file  . Highest education level: Not on file  Occupational History  . Not on file  Tobacco Use  . Smoking status: Never Smoker  Substance and Sexual Activity  . Alcohol use: Not on file  . Drug use: Not on file  . Sexual activity: Not on file  Other Topics Concern  . Not on file  Social History Narrative  . Not on file   Social Determinants of Health   Financial Resource  Strain:   . Difficulty of Paying Living Expenses:   Food Insecurity:   . Worried About Programme researcher, broadcasting/film/video in the Last Year:   . Barista in the Last Year:   Transportation Needs:   . Freight forwarder (Medical):   Marland Kitchen Lack of Transportation (Non-Medical):   Physical Activity:   . Days of Exercise per Week:   . Minutes of Exercise per Session:   Stress:   . Feeling of Stress :   Social Connections:   . Frequency of Communication with Friends and Family:   . Frequency of Social Gatherings with Friends and Family:   .  Attends Religious Services:   . Active Member of Clubs or Organizations:   . Attends Banker Meetings:   Marland Kitchen Marital Status:   Intimate Partner Violence:   . Fear of Current or Ex-Partner:   . Emotionally Abused:   Marland Kitchen Physically Abused:   . Sexually Abused:     Family History:   The patient's family history includes Heart disease in her father.    ROS:  Please see the history of present illness.  All other ROS reviewed and negative.     Physical Exam/Data:   Vitals:   02/23/20 2107 02/23/20 2108 02/23/20 2156 02/23/20 2215  BP: (!) 175/97     Pulse: 73  79 64  Resp: 20  20 15   Temp: 97.9 F (36.6 C)     TempSrc: Oral     SpO2: 99%  97% 95%  Weight:  65.8 kg    Height:  4' 11.5" (1.511 m)     No intake or output data in the 24 hours ending 02/23/20 2319 Last 3 Weights 02/23/2020 11/27/2014  Weight (lbs) 145 lb 125 lb  Weight (kg) 65.772 kg 56.7 kg     Body mass index is 28.8 kg/m.  General:  Well nourished, well developed, in no acute distress HEENT: normal Lymph: no adenopathy Neck: no JVD Endocrine:  No thryomegaly Vascular: No carotid bruits; FA pulses 2+ bilaterally without bruits  Cardiac:  normal S1, S2; RRR; no murmur  Lungs:  clear to auscultation bilaterally, no wheezing, rhonchi or rales  Abd: soft, nontender, no hepatomegaly  Ext: no edema Musculoskeletal:  No deformities, BUE and BLE strength normal and equal Skin: warm and dry  Psych:  Normal affect    EKG:  The ECG that was done on arrival to the ED was personally reviewed and demonstrates NSR, RBBB, no significant ST or T wave abnormalities  Relevant CV Studies: No recent CV studies  Laboratory Data:  High Sensitivity Troponin:   Recent Labs  Lab 02/23/20 2125  TROPONINIHS 372*      Chemistry Recent Labs  Lab 02/23/20 2125  NA 135  K 4.0  CL 103  CO2 22  GLUCOSE 123*  BUN 11  CREATININE 0.86  CALCIUM 8.3*  GFRNONAA >60  GFRAA >60  ANIONGAP 10    Recent Labs  Lab  02/23/20 2125  PROT 7.1  ALBUMIN 3.4*  AST 21  ALT 13  ALKPHOS 71  BILITOT 0.4   Hematology Recent Labs  Lab 02/23/20 2125  WBC 8.8  RBC 4.27  HGB 13.9  HCT 41.9  MCV 98.1  MCH 32.6  MCHC 33.2  RDW 13.1  PLT 351   BNPNo results for input(s): BNP, PROBNP in the last 168 hours.  DDimer  Recent Labs  Lab 02/23/20 2125  DDIMER 0.44     Radiology/Studies:  DG Chest 2 View  Result Date: 02/23/2020 CLINICAL DATA:  Shortness of breath, diaphoresis EXAM: CHEST - 2 VIEW COMPARISON:  08/14/2009 FINDINGS: Lungs are clear.  No pleural effusion or pneumothorax. The heart is normal in size. Mild degenerative changes of the visualized thoracolumbar spine. IMPRESSION: Normal chest radiographs. Electronically Signed   By: Charline Bills M.D.   On: 02/23/2020 22:16      {Click here for [place] or NSTEMI  THEN REFRESH NOTE    :[id number]} TIMI Risk Score for Unstable Angina or Non-ST Elevation MI:   The patient's TIMI risk score is 1, which indicates a 5% risk of all cause mortality, new or recurrent myocardial infarction or need for urgent revascularization in the next 14 days.   Assessment and Plan:  Kayla [name] is a 64 y.o. female  with history of HTN presents with jaw pain, found to have elevated troponin.   #) Neck/Head/Jaw pain and elevated troponin: patient's symptoms atypical in nature, however most likely attributable to ACS given associated SOB and elevated troponin. She has no cardiac murmurs that suggest acute aortic syndrome and her carotid exam is normal which argues against carotid dissection. Furthermore her peripheral neurologic exam is normal. It however is a bit unusual that her anginal equivalent would be predominantly R sided neck pain with headache, the differential diagnosis for which of course includes carotid artery dissection. We will proceed with evaluation and treatment for NSTE-ACS at this time, though will also plan to perform CTA of the neck tonight to confirm  that her head/neck symptoms are not due to carotid pathology. Her d-dimer is with normal limits, thus pointing away from PE as a possible etiology for her SOB and neck/jaw pain.  Diagnostics: - repeat troponin  - CTA of neck to rule out carotid artery dissection - echo in AM - check lipids, A1c  Therapeutics: - NPO for cath in AM - ASA 324mg  then 81mg  daily - heparin drip for ACS per pharmacy protocol - start atorvastatin 80mg  QHS - start low dose metoprolol (per patient report she has had issues with alternating hypo/hypertension in past while on beta blockers) - hold ACE for now given 2 contrast loads within 24 hours - SLN, nitro gtt PRN - defer P2Y12 until after cath - cardiac rehab  Severity of Illness: The appropriate patient status for this patient is INPATIENT. Inpatient status is judged to be reasonable and necessary in order to provide the required intensity of service to ensure the patient's safety. The patient's presenting symptoms, physical exam findings, and initial radiographic and laboratory data in the context of their chronic comorbidities is felt to place them at high risk for further clinical deterioration. Furthermore, it is not anticipated that the patient will be medically stable for discharge from the hospital within 2 midnights of admission. The following factors support the patient status of inpatient.   " The patient's presenting symptoms include neck pain. " The worrisome physical exam findings include none. " The initial radiographic and laboratory data are worrisome because of elevated troponin. " The chronic co-morbidities include age.   * I certify that at the point of admission it is my clinical judgment that the patient will require inpatient hospital care spanning beyond 2 midnights from the point of admission due to high intensity of service, high risk for further deterioration and high frequency of surveillance required.*    For questions or updates,  please contact [facility] Please consult [email or web address] for contact info under  Signed, Marcie Mowers, MD  02/23/2020 11:19 PM

## 2020-02-23 NOTE — ED Provider Notes (Signed)
[facility] EMERGENCY DEPARTMENT Provider Note   CSN: [id number] Arrival date & time: 02/23/20  2057     History Chief Complaint  Patient presents with  . Shortness of Breath  . Headache    Kayla [name] is a 64 y.o. female.  64 year old female with history of hypertension who presents with multiple complaints including shortness of breath, sweatiness, and jaw pain.  Earlier today after walking up and down stairs, patient suddenly began having dizziness associated with shortness of breath, diaphoresis, jaw pain, and heart palpitations.  She took a baby aspirin and laid down which seemed to help her symptoms but she began having a headache later.  She denies any chest pain during this episode.  She notes that she has had some shortness of breath with exertion lately but denies any associated chest pain.  She denies any leg swelling/pain, recent travel, history of blood clots, history of cancer, or estrogen use. No cough/cold sx or fevers.  No tobacco use. FH notable for parent w/ heart disease/CABG.  The history is provided by the patient.  Shortness of Breath Associated symptoms: headaches   Headache      Past Medical History:  Diagnosis Date  . Hypertension     Patient Active Problem List   Diagnosis Date Noted  . NSTEMI (non-ST elevated myocardial infarction) (HCC) 02/23/2020  . Essential hypertension 11/27/2014    ** The histories are not reviewed yet. Please review them in the "History" navigator section and refresh this SmartLink.   OB History   No obstetric history on file.     Family History  Problem Relation Age of Onset  . Heart disease Father     Social History   Tobacco Use  . Smoking status: Never Smoker  Substance Use Topics  . Alcohol use: Not on file  . Drug use: Not on file    Home Medications Prior to Admission medications   Medication Sig Start Date End Date Taking? Authorizing Provider  metoprolol tartrate  (LOPRESSOR) 25 MG tablet Take 0.5 tablets (12.5 mg total) by mouth 2 (two) times daily as needed. For systolic BP greater than 150. 8/41/32   Corky Crafts, MD    Allergies    Percodan [oxycodone-aspirin] and Lisinopril  Review of Systems   Review of Systems  Respiratory: Positive for shortness of breath.   Neurological: Positive for headaches.   All other systems reviewed and are negative except that which was mentioned in HPI  Physical Exam Updated Vital Signs BP (!) 175/97 (BP Location: Right Arm)   Pulse 64   Temp 97.9 F (36.6 C) (Oral)   Resp 15   Ht 4' 11.5" (1.511 m)   Wt 65.8 kg   SpO2 95%   BMI 28.80 kg/m   Physical Exam Vitals and nursing note reviewed.  Constitutional:      General: She is not in acute distress.    Appearance: She is well-developed.  HENT:     Head: Normocephalic and atraumatic.  Eyes:     Conjunctiva/sclera: Conjunctivae normal.  Cardiovascular:     Rate and Rhythm: Normal rate and regular rhythm.     Heart sounds: Normal heart sounds. No murmur.  Pulmonary:     Effort: Pulmonary effort is normal.     Breath sounds: Normal breath sounds.  Abdominal:     General: Bowel sounds are normal. There is no distension.     Palpations: Abdomen is soft.     Tenderness: There is  no abdominal tenderness.  Musculoskeletal:     Cervical back: Neck supple.  Skin:    General: Skin is warm and dry.  Neurological:     Mental Status: She is alert and oriented to person, place, and time.     Comments: Fluent speech  Psychiatric:        Judgment: Judgment normal.     ED Results / Procedures / Treatments   Labs (all labs ordered are listed, but only abnormal results are displayed) Labs Reviewed  COMPREHENSIVE METABOLIC PANEL - Abnormal; Notable for the following components:      Result Value   Glucose, Bld 123 (*)    Calcium 8.3 (*)    Albumin 3.4 (*)    All other components within normal limits  TROPONIN I (HIGH SENSITIVITY) -  Abnormal; Notable for the following components:   Troponin I (High Sensitivity) 372 (*)    All other components within normal limits  SARS CORONAVIRUS 2 (TAT 6-24 HRS)  D-DIMER, QUANTITATIVE (NOT AT [facility])  CBC WITH DIFFERENTIAL/PLATELET  HEPARIN LEVEL (UNFRACTIONATED)  CBC  TROPONIN I (HIGH SENSITIVITY)    EKG EKG Interpretation  Date/Time:  Sunday February 23 2020 21:08:43 EDT Ventricular Rate:  74 PR Interval:    QRS Duration: 121 QT Interval:  423 QTC Calculation: 470 R Axis:   30 Text Interpretation: Sinus rhythm Right bundle branch block similar to previous Confirmed by Frederick Peers [phone]) on 02/23/2020 10:16:48 PM   Radiology DG Chest 2 View  Result Date: 02/23/2020 CLINICAL DATA:  Shortness of breath, diaphoresis EXAM: CHEST - 2 VIEW COMPARISON:  08/14/2009 FINDINGS: Lungs are clear.  No pleural effusion or pneumothorax. The heart is normal in size. Mild degenerative changes of the visualized thoracolumbar spine. IMPRESSION: Normal chest radiographs. Electronically Signed   By: Charline Bills M.D.   On: 02/23/2020 22:16    Procedures Procedures (including critical care time) CRITICAL CARE Performed by: Ambrose Finland Ramiyah Mcclenahan   Total critical care time: 30 minutes  Critical care time was exclusive of separately billable procedures and treating other patients.  Critical care was necessary to treat or prevent imminent or life-threatening deterioration.  Critical care was time spent personally by me on the following activities: development of treatment plan with patient and/or surrogate as well as nursing, discussions with consultants, evaluation of patient's response to treatment, examination of patient, obtaining history from patient or surrogate, ordering and performing treatments and interventions, ordering and review of laboratory studies, ordering and review of radiographic studies, pulse oximetry and re-evaluation of patient's condition.  Medications Ordered in  ED Medications  heparin bolus via infusion 3,500 Units (has no administration in time range)  heparin ADULT infusion 100 units/mL (25000 units/224mL sodium chloride 0.45%) (has no administration in time range)  aspirin chewable tablet 243 mg (243 mg Oral Given 02/23/20 2238)    ED Course  I have reviewed the triage vital signs and the nursing notes.  Pertinent labs & imaging results that were available during my care of the patient were reviewed by me and considered in my medical decision making (see chart for details).    MDM Rules/Calculators/A&P                      Pt comfortable on exam, hypertensive. EKG similar to previous. Labs notable for trop 372. D-dimer negative. Gave ASA, started heparin drip, and discussed w/ cardiology, Alinda Money, who will admit for further care. Final Clinical Impression(s) / ED Diagnoses Final diagnoses:  NSTEMI (  non-ST elevated myocardial infarction) [facility])    Rx / [place] Orders ED Discharge Orders    None       Donis Pinder, Wenda Overland, MD 02/23/20 [phone]

## 2020-02-23 NOTE — ED Notes (Signed)
EDP notified of positive trop.  Cardiologist bedside at this time.

## 2020-02-23 NOTE — Progress Notes (Signed)
ANTICOAGULATION CONSULT NOTE - Initial Consult  Pharmacy Consult for heparin  Indication: chest pain/ACS  Allergies  Allergen Reactions  . Percodan [Oxycodone-Aspirin] Nausea And Vomiting  . Lisinopril Other (See Comments)    unkown    Patient Measurements: Height: 4' 11.5" (151.1 cm) Weight: 65.8 kg (145 lb) IBW/kg (Calculated) : 44.35 Heparin Dosing Weight: 58.5 kg  Vital Signs: Temp: 97.9 F (36.6 C) (04/11 2107) Temp Source: Oral (04/11 2107) BP: 175/97 (04/11 2107) Pulse Rate: 64 (04/11 2215)  Labs: Recent Labs    02/23/20 2125  HGB 13.9  HCT 41.9  PLT 351  CREATININE 0.86  TROPONINIHS 372*    Estimated Creatinine Clearance: 55.3 mL/min (by C-G formula based on SCr of 0.86 mg/dL).   Medical History: No past medical history on file.  Assessment: 50 yof presenting with jaw pain, diaphoresis, SOB, and palpations. No AC listed PTA.  Hgb 13.9, plt 351. D-dimer 0.44, trop 372. No s/sx of bleeding.   Goal of Therapy:  Heparin level 0.3-0.7 units/ml Monitor platelets by anticoagulation protocol: Yes   Plan:  Give 3500 units bolus x 1 Start heparin infusion at 700 units/hr Check anti-Xa level in 6 hours and daily while on heparin Continue to monitor H&H and platelets  Sherron Monday, PharmD, BCCCP Clinical Pharmacist  Phone: [phone] 02/23/2020 10:46 PM  Please check AMION for all [facility] Pharmacy phone numbers After 10:00 PM, call Main Pharmacy [phone]

## 2020-02-23 NOTE — ED Notes (Signed)
Pt to DG. 

## 2020-02-23 NOTE — ED Triage Notes (Signed)
Per EMS, pt from home reports earlier today she had sudden onset of dizziness, SOB, diaphoresis, jaw pain, and palpations upon ambulation.  Pt states she took a baby ASA and the layed down.   When she got up she found she had a bad headache.  No cardiac hx, only hx is MS.

## 2020-02-24 ENCOUNTER — Inpatient Hospital Stay (HOSPITAL_COMMUNITY): Payer: BC Managed Care – PPO

## 2020-02-24 ENCOUNTER — Encounter (HOSPITAL_COMMUNITY)
Admission: EM | Disposition: A | Discharge status: Home / Self Care | Payer: Self-pay | Source: Home / Self Care | Attending: Internal Medicine

## 2020-02-24 DIAGNOSIS — I10 Essential (primary) hypertension: Secondary | ICD-10-CM

## 2020-02-24 DIAGNOSIS — I2510 Atherosclerotic heart disease of native coronary artery without angina pectoris: Secondary | ICD-10-CM | POA: Diagnosis not present

## 2020-02-24 DIAGNOSIS — I519 Heart disease, unspecified: Secondary | ICD-10-CM | POA: Diagnosis not present

## 2020-02-24 DIAGNOSIS — I25110 Atherosclerotic heart disease of native coronary artery with unstable angina pectoris: Secondary | ICD-10-CM | POA: Diagnosis not present

## 2020-02-24 DIAGNOSIS — I6521 Occlusion and stenosis of right carotid artery: Secondary | ICD-10-CM | POA: Diagnosis not present

## 2020-02-24 DIAGNOSIS — R0602 Shortness of breath: Secondary | ICD-10-CM | POA: Diagnosis not present

## 2020-02-24 DIAGNOSIS — Z20822 Contact with and (suspected) exposure to covid-19: Secondary | ICD-10-CM | POA: Diagnosis not present

## 2020-02-24 DIAGNOSIS — I214 Non-ST elevation (NSTEMI) myocardial infarction: Secondary | ICD-10-CM

## 2020-02-24 DIAGNOSIS — I5181 Takotsubo syndrome: Secondary | ICD-10-CM | POA: Diagnosis not present

## 2020-02-24 HISTORY — PX: LEFT HEART CATH AND CORONARY ANGIOGRAPHY: CATH118249

## 2020-02-24 LAB — CBC
HCT: 41 % (ref 36.0–46.0)
Hemoglobin: 13.4 g/dL (ref 12.0–15.0)
MCH: 31.3 pg (ref 26.0–34.0)
MCHC: 32.7 g/dL (ref 30.0–36.0)
MCV: 95.8 fL (ref 80.0–100.0)
Platelets: 359 10*3/uL (ref 150–400)
RBC: 4.28 MIL/uL (ref 3.87–5.11)
RDW: 12.9 % (ref 11.5–15.5)
WBC: 10.9 10*3/uL — ABNORMAL HIGH (ref 4.0–10.5)
nRBC: 0 % (ref 0.0–0.2)

## 2020-02-24 LAB — LIPID PANEL
Cholesterol: 168 mg/dL (ref 0–200)
HDL: 52 mg/dL (ref >40)
LDL Cholesterol: 99 mg/dL (ref 0–99)
Total CHOL/HDL Ratio: 3.2 RATIO
Triglycerides: 84 mg/dL (ref <150)
VLDL: 17 mg/dL (ref 0–40)

## 2020-02-24 LAB — TROPONIN I (HIGH SENSITIVITY): Troponin I (High Sensitivity): 1869 ng/L (ref <18)

## 2020-02-24 LAB — HEPARIN LEVEL (UNFRACTIONATED)
Heparin Unfractionated: 0.87 IU/mL — ABNORMAL HIGH (ref 0.30–0.70)
Heparin Unfractionated: 0.16 IU/mL — ABNORMAL LOW (ref 0.30–0.70)

## 2020-02-24 LAB — BASIC METABOLIC PANEL
Anion gap: 10 (ref 5–15)
BUN: 9 mg/dL (ref 8–23)
CO2: 22 mmol/L (ref 22–32)
Calcium: 8.5 mg/dL — ABNORMAL LOW (ref 8.9–10.3)
Chloride: 107 mmol/L (ref 98–111)
Creatinine, Ser: 0.73 mg/dL (ref 0.44–1.00)
GFR calc Af Amer: >60 mL/min (ref >60)
GFR calc non Af Amer: >60 mL/min (ref >60)
Glucose, Bld: 108 mg/dL — ABNORMAL HIGH (ref 70–99)
Potassium: 4.2 mmol/L (ref 3.5–5.1)
Sodium: 139 mmol/L (ref 135–145)

## 2020-02-24 LAB — ECHOCARDIOGRAM COMPLETE
Height: 59.5 in
Weight: 2338.64 oz

## 2020-02-24 LAB — PROTIME-INR
INR: 1 (ref 0.8–1.2)
Prothrombin Time: 12.9 seconds (ref 11.4–15.2)

## 2020-02-24 LAB — HIV ANTIBODY (ROUTINE TESTING W REFLEX): HIV Screen 4th Generation wRfx: NONREACTIVE

## 2020-02-24 LAB — MRSA PCR SCREENING: MRSA by PCR: NEGATIVE

## 2020-02-24 LAB — SARS CORONAVIRUS 2 (TAT 6-24 HRS): SARS Coronavirus 2: NEGATIVE

## 2020-02-24 SURGERY — LEFT HEART CATH AND CORONARY ANGIOGRAPHY
Anesthesia: LOCAL

## 2020-02-24 MED ORDER — VERAPAMIL HCL 2.5 MG/ML IV SOLN
INTRAVENOUS | Status: DC | PRN
  Administered 2020-02-24: 17:00:00 10 mL via INTRA_ARTERIAL

## 2020-02-24 MED ORDER — ATORVASTATIN CALCIUM 80 MG PO TABS
80.0000 mg | ORAL_TABLET | Freq: Every day | ORAL | Status: DC
  Administered 2020-02-24 (×2): 80 mg via ORAL
  Filled 2020-02-24 (×2): qty 1

## 2020-02-24 MED ORDER — NITROGLYCERIN 0.4 MG SL SUBL: 0.4000 mg | SUBLINGUAL_TABLET | SUBLINGUAL | Status: DC | PRN

## 2020-02-24 MED ORDER — HYDRALAZINE HCL 25 MG PO TABS: 25.0000 mg | ORAL_TABLET | Freq: Four times a day (QID) | ORAL | Status: DC | PRN

## 2020-02-24 MED ORDER — NITROGLYCERIN 1 MG/10 ML FOR IR/CATH LAB
INTRA_ARTERIAL | Status: AC
  Filled 2020-02-24: qty 10

## 2020-02-24 MED ORDER — SODIUM CHLORIDE 0.9 % WEIGHT BASED INFUSION
1.0000 mL/kg/h | INTRAVENOUS | Status: DC
  Administered 2020-02-24: 1 mL/kg/h via INTRAVENOUS

## 2020-02-24 MED ORDER — MIDAZOLAM HCL 2 MG/2ML IJ SOLN
INTRAMUSCULAR | Status: DC | PRN
  Administered 2020-02-24 (×2): 1 mg via INTRAVENOUS

## 2020-02-24 MED ORDER — FENTANYL CITRATE (PF) 100 MCG/2ML IJ SOLN
INTRAMUSCULAR | Status: AC
  Filled 2020-02-24: qty 2

## 2020-02-24 MED ORDER — IOHEXOL 350 MG/ML SOLN
INTRAVENOUS | Status: DC | PRN
  Administered 2020-02-24: 40 mL via INTRA_ARTERIAL

## 2020-02-24 MED ORDER — VERAPAMIL HCL 2.5 MG/ML IV SOLN
INTRAVENOUS | Status: AC
  Filled 2020-02-24: qty 2

## 2020-02-24 MED ORDER — HYDRALAZINE HCL 20 MG/ML IJ SOLN: 10.0000 mg | INTRAMUSCULAR | Status: AC | PRN

## 2020-02-24 MED ORDER — SODIUM CHLORIDE 0.9% FLUSH: 3.0000 mL | INTRAVENOUS | Status: DC | PRN

## 2020-02-24 MED ORDER — IOHEXOL 350 MG/ML SOLN
80.0000 mL | Freq: Once | INTRAVENOUS | Status: AC | PRN
  Administered 2020-02-24: 80 mL via INTRAVENOUS

## 2020-02-24 MED ORDER — HEPARIN SODIUM (PORCINE) 1000 UNIT/ML IJ SOLN
INTRAMUSCULAR | Status: DC | PRN
  Administered 2020-02-24: 3500 [IU] via INTRAVENOUS

## 2020-02-24 MED ORDER — ONDANSETRON HCL 4 MG/2ML IJ SOLN: 4.0000 mg | Freq: Four times a day (QID) | INTRAMUSCULAR | Status: DC | PRN

## 2020-02-24 MED ORDER — NITROGLYCERIN 1 MG/10 ML FOR IR/CATH LAB
INTRA_ARTERIAL | Status: DC | PRN
  Administered 2020-02-24: 200 ug via INTRACORONARY

## 2020-02-24 MED ORDER — FENTANYL CITRATE (PF) 100 MCG/2ML IJ SOLN
INTRAMUSCULAR | Status: DC | PRN
  Administered 2020-02-24: 50 ug via INTRAVENOUS
  Administered 2020-02-24: 25 ug via INTRAVENOUS

## 2020-02-24 MED ORDER — MIDAZOLAM HCL 2 MG/2ML IJ SOLN
INTRAMUSCULAR | Status: AC
  Filled 2020-02-24: qty 2

## 2020-02-24 MED ORDER — SODIUM CHLORIDE 0.9 % IV SOLN: 250.0000 mL | INTRAVENOUS | Status: DC | PRN

## 2020-02-24 MED ORDER — SODIUM CHLORIDE 0.9 % WEIGHT BASED INFUSION
3.0000 mL/kg/h | INTRAVENOUS | Status: DC
  Administered 2020-02-24: 3 mL/kg/h via INTRAVENOUS

## 2020-02-24 MED ORDER — HEPARIN BOLUS VIA INFUSION
1500.0000 [IU] | Freq: Once | INTRAVENOUS | Status: AC
  Administered 2020-02-24: 1500 [IU] via INTRAVENOUS
  Filled 2020-02-24: qty 1500

## 2020-02-24 MED ORDER — HEPARIN (PORCINE) IN NACL 1000-0.9 UT/500ML-% IV SOLN
INTRAVENOUS | Status: DC | PRN
  Administered 2020-02-24 (×2): 500 mL

## 2020-02-24 MED ORDER — LIDOCAINE HCL (PF) 1 % IJ SOLN
INTRAMUSCULAR | Status: DC | PRN
  Administered 2020-02-24: 2 mL

## 2020-02-24 MED ORDER — SODIUM CHLORIDE 0.9% FLUSH
3.0000 mL | Freq: Two times a day (BID) | INTRAVENOUS | Status: DC
  Administered 2020-02-24: 3 mL via INTRAVENOUS

## 2020-02-24 MED ORDER — METOPROLOL SUCCINATE ER 25 MG PO TB24
12.5000 mg | ORAL_TABLET | Freq: Every day | ORAL | Status: DC
  Administered 2020-02-24 – 2020-02-25 (×2): 12.5 mg via ORAL
  Filled 2020-02-24 (×3): qty 1

## 2020-02-24 MED ORDER — ASPIRIN EC 81 MG PO TBEC
81.0000 mg | DELAYED_RELEASE_TABLET | Freq: Every day | ORAL | Status: DC
  Administered 2020-02-24 – 2020-02-25 (×2): 81 mg via ORAL
  Filled 2020-02-24 (×2): qty 1

## 2020-02-24 MED ORDER — ACETAMINOPHEN 325 MG PO TABS: 650.0000 mg | ORAL_TABLET | ORAL | Status: DC | PRN

## 2020-02-24 MED ORDER — LIDOCAINE HCL (PF) 1 % IJ SOLN
INTRAMUSCULAR | Status: AC
  Filled 2020-02-24: qty 30

## 2020-02-24 MED ORDER — ZOLPIDEM TARTRATE 5 MG PO TABS
5.0000 mg | ORAL_TABLET | Freq: Every evening | ORAL | Status: DC | PRN
  Administered 2020-02-24: 5 mg via ORAL
  Filled 2020-02-24: qty 1

## 2020-02-24 MED ORDER — HEPARIN (PORCINE) IN NACL 1000-0.9 UT/500ML-% IV SOLN
INTRAVENOUS | Status: AC
  Filled 2020-02-24: qty 1000

## 2020-02-24 MED ORDER — ENOXAPARIN SODIUM 40 MG/0.4ML ~~LOC~~ SOLN
40.0000 mg | SUBCUTANEOUS | Status: DC
  Filled 2020-02-24: qty 0.4

## 2020-02-24 SURGICAL SUPPLY — 12 items
CATH 5FR JL3.5 JR4 ANG PIG MP (CATHETERS) ×1 IMPLANT
CATH INFINITI 4FR 145 PIGTAIL (CATHETERS) ×1 IMPLANT
CATH INFINITI 4FR JL3.5 (CATHETERS) ×1 IMPLANT
DEVICE RAD COMP TR BAND LRG (VASCULAR PRODUCTS) ×1 IMPLANT
GLIDESHEATH SLEND SS 6F .021 (SHEATH) ×1 IMPLANT
GUIDEWIRE INQWIRE 1.5J.035X260 (WIRE) IMPLANT
INQWIRE 1.5J .035X260CM (WIRE) ×2
KIT HEART LEFT (KITS) ×2 IMPLANT
PACK CARDIAC CATHETERIZATION (CUSTOM PROCEDURE TRAY) ×2 IMPLANT
SYR MEDRAD MARK 7 150ML (SYRINGE) ×2 IMPLANT
TRANSDUCER W/STOPCOCK (MISCELLANEOUS) ×2 IMPLANT
TUBING CIL FLEX 10 FLL-RA (TUBING) ×2 IMPLANT

## 2020-02-24 NOTE — Progress Notes (Signed)
ANTICOAGULATION CONSULT NOTE  Pharmacy Consult for heparin  Indication: chest pain/ACS  Allergies  Allergen Reactions  . Beta Adrenergic Blockers Shortness Of Breath    Unnamed beta blocker (name not recalled by the patient) = shortness of breath  . Percodan [Oxycodone-Aspirin] Nausea And Vomiting  . Lisinopril Other (See Comments)    Patient does not recall the reaction  . Pork-Derived Products Other (See Comments)    Religious reasons = No pork  . Nickel Itching and Rash    Patient Measurements: Height: 4' 11.5" (151.1 cm) Weight: 66.3 kg (146 lb 2.6 oz) IBW/kg (Calculated) : 44.35 Heparin Dosing Weight: 58.5 kg  Vital Signs: Temp: 97.8 F (36.6 C) (04/12 0829) Temp Source: Oral (04/12 0829) BP: 145/98 (04/12 0829) Pulse Rate: 71 (04/12 0829)  Labs: Recent Labs    02/23/20 2125 02/24/20 0106 02/24/20 0819  HGB 13.9 13.4  --   HCT 41.9 41.0  --   PLT 351 359  --   LABPROT  --  12.9  --   INR  --  1.0  --   HEPARINUNFRC  --  0.87* 0.16*  CREATININE 0.86 0.73  --   TROPONINIHS 372* 1,869*  --     Estimated Creatinine Clearance: 59.7 mL/min (by C-G formula based on SCr of 0.73 mg/dL).   Medical History: Past Medical History:  Diagnosis Date  . Hypertension     Assessment: 48 yof presenting with jaw pain, diaphoresis, SOB, and palpations. No AC listed PTA. Pharmacy dosing heparin for NSTEMI. Plans for cath today   Goal of Therapy:  Heparin level 0.3-0.7 units/ml Monitor platelets by anticoagulation protocol: Yes   Plan:  -heparin bolus 1500 units then increase infusion to 850 units/hr -Will follow plans post cath  Harland German, PharmD Clinical Pharmacist **Pharmacist phone directory can now be found on [email or web address] ([email or web address]).  Listed under [facility].

## 2020-02-24 NOTE — Plan of Care (Signed)
Plan of Care

## 2020-02-24 NOTE — ED Notes (Signed)
Attempted to call report

## 2020-02-24 NOTE — Progress Notes (Addendum)
Progress Note  Patient Name: Kayla [name] Date of Encounter: 02/24/2020  Primary Cardiologist: New to Dr. Allyson Sabal   Subjective   Feeling well. No chest pain, sob or palpitations.   Inpatient Medications    Scheduled Meds: . aspirin EC  81 mg Oral Daily  . atorvastatin  80 mg Oral q1800  . metoprolol succinate  12.5 mg Oral Daily   Continuous Infusions: . heparin 700 Units/hr (02/23/20 2358)   PRN Meds: acetaminophen, nitroGLYCERIN, ondansetron (ZOFRAN) IV   Vital Signs    Vitals:   02/24/20 0001 02/24/20 0015 02/24/20 0100 02/24/20 0347  BP: (!) 153/100 (!) 168/101 (!) 149/66 (!) 146/93  Pulse: 72 72 76 75  Resp: 15 15  16   Temp:   98.2 F (36.8 C) 98.2 F (36.8 C)  TempSrc:   Oral Oral  SpO2: 96% 98% 97% 98%  Weight:   66.3 kg   Height:   4' 11.5" (1.511 m)     Intake/Output Summary (Last 24 hours) at 02/24/2020 0758 Last data filed at 02/24/2020 0400 Gross per 24 hour  Intake 0 ml  Output --  Net 0 ml   Last 3 Weights 02/24/2020 02/23/2020 11/27/2014  Weight (lbs) 146 lb 2.6 oz 145 lb 125 lb  Weight (kg) 66.3 kg 65.772 kg 56.7 kg      Telemetry    NSR - Personally Reviewed  ECG    NSR with RBBB - Personally Reviewed  Physical Exam   GEN: No acute distress.   Neck: No JVD Cardiac: RRR, no murmurs, rubs, or gallops.  Respiratory: Clear to auscultation bilaterally. GI: Soft, nontender, non-distended  MS: No edema; No deformity. Neuro:  Nonfocal  Psych: Normal affect   Labs    High Sensitivity Troponin:   Recent Labs  Lab 02/23/20 2125 02/24/20 0106  TROPONINIHS 372* 1,869*      Chemistry Recent Labs  Lab 02/23/20 2125 02/24/20 0106  NA 135 139  K 4.0 4.2  CL 103 107  CO2 22 22  GLUCOSE 123* 108*  BUN 11 9  CREATININE 0.86 0.73  CALCIUM 8.3* 8.5*  PROT 7.1  --   ALBUMIN 3.4*  --   AST 21  --   ALT 13  --   ALKPHOS 71  --   BILITOT 0.4  --   GFRNONAA >60 >60  GFRAA >60 >60  ANIONGAP 10 10     Hematology Recent Labs   Lab 02/23/20 2125 02/24/20 0106  WBC 8.8 10.9*  RBC 4.27 4.28  HGB 13.9 13.4  HCT 41.9 41.0  MCV 98.1 95.8  MCH 32.6 31.3  MCHC 33.2 32.7  RDW 13.1 12.9  PLT 351 359    BNPNo results for input(s): BNP, PROBNP in the last 168 hours.   DDimer  Recent Labs  Lab 02/23/20 2125  DDIMER 0.44     Radiology    CT Angio Head W or Wo Contrast  Result Date: 02/24/2020 CLINICAL DATA:  Initial evaluation for acute neck and jaw pain, elevated troponin. EXAM: CT ANGIOGRAPHY HEAD AND NECK TECHNIQUE: Multidetector CT imaging of the head and neck was performed using the standard protocol during bolus administration of intravenous contrast. Multiplanar CT image reconstructions and MIPs were obtained to evaluate the vascular anatomy. Carotid stenosis measurements (when applicable) are obtained utilizing NASCET criteria, using the distal internal carotid diameter as the denominator. CONTRAST:  38mL OMNIPAQUE IOHEXOL 350 MG/ML SOLN COMPARISON:  Prior head CT from 06/11/2013. FINDINGS: CT HEAD FINDINGS Brain: Cerebral  volume within normal limits. Few scattered hypodense lesions seen involving the periventricular and deep white matter both cerebral hemispheres, consistent with history of demyelinating disease, similar to previous. No acute intracranial hemorrhage. No acute large vessel territory infarct. No mass lesion, midline shift or mass effect. No hydrocephalus or extra-axial fluid collection. Vascular: No hyperdense vessel. Skull: Scalp soft tissues and calvarium within normal limits. Sinuses: Paranasal sinuses are clear.  No mastoid effusion. Orbits: Globes and orbital soft tissues within normal limits. Review of the MIP images confirms the above findings CTA NECK FINDINGS Aortic arch: Visualized aortic arch of normal caliber with normal 3 vessel morphology. No hemodynamically significant stenosis seen about the origin of the great vessels. Partially visualized subclavian arteries widely patent. Right  carotid system: Right common carotid artery patent from its origin to the bifurcation without stenosis or other abnormality. Short-segment atheromatous stenosis of up to 35% at the origin of the right ICA. Right ICA widely patent distally to the skull base without stenosis, dissection or occlusion. Left carotid system: Left common and internal carotid arteries widely patent without stenosis, dissection, or occlusion. Vertebral arteries: Both vertebral arteries arise from the subclavian arteries. Right vertebral artery dominant, with a diffusely hypoplastic left vertebral artery. Both vertebral arteries patent within the neck without stenosis, dissection or occlusion. Skeleton: No acute osseous abnormality. No discrete or worrisome osseous lesions. 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 Other neck: No other acute soft tissue abnormality within neck. No mass lesion or adenopathy. Upper chest: Visualized upper chest demonstrates no other acute abnormality. Review of the MIP images confirms the above findings CTA HEAD FINDINGS Anterior circulation: Petrous segments widely patent bilaterally. Minor atheromatous change within the cavernous/supraclinoid ICAs without stenosis or other abnormality. A1 segments widely patent. Normal anterior communicating artery complex. Anterior cerebral arteries widely patent to their distal aspects without stenosis. No M1 stenosis or occlusion. Normal MCA bifurcations. Distal MCA branches well perfused and symmetric. Posterior circulation: Dominant right vertebral artery widely patent to the vertebrobasilar junction without stenosis. Patent right PICA. Hypoplastic left vertebral artery largely terminates in PICA, although a tiny branch ascending towards the vertebrobasilar junction. Left PICA patent as well. Basilar diminutive but patent to its distal aspect without stenosis. Superior cerebral arteries patent bilaterally. Right PCA supplied via the basilar.  Fetal type origin of the left PCA. Both PCAs well perfused to their distal aspects without stenosis. Venous sinuses: Grossly patent allowing for timing the contrast bolus. Anatomic variants: Fetal type origin left PCA. No intracranial aneurysm. Review of the MIP images confirms the above findings IMPRESSION: CT HEAD IMPRESSION: 1. No acute intracranial abnormality. 2. Scattered hypodensities involving the supratentorial cerebral white matter, consistent with history of demyelinating disease. Overall, appearance is grossly similar as compared to prior CT from 2014. CTA HEAD AND NECK IMPRESSION: 1. Negative CTA of the head and neck for dissection or other acute vascular abnormality. 2. Short-segment mild 35% stenosis at the origin of the right ICA. 3. Additional minor atheromatous change for age elsewhere about the major arterial vasculature of the head and neck. No other hemodynamically significant or correctable stenosis. Electronically Signed   By: Benjamin  McClintock M.D.   On: 02/24/2020 04:45   DG Chest 2 View  Result Date: 02/23/2020 CLINICAL DATA:  Shortness of breath, diaphoresis EXAM: CHEST - 2 VIEW COMPARISON:  08/14/2009 FINDINGS: Lungs are clear.  No pleural effusion or pneumothorax. The heart is normal in size. Mild degenerative changes of the visualized thoracolumbar spine. IMPRESSION:   Normal chest radiographs. Electronically Signed   By: Julian Hy M.D.   On: 02/23/2020 22:16   CT Angio Neck W and/or Wo Contrast  Result Date: 02/24/2020 CLINICAL DATA:  Initial evaluation for acute neck and jaw pain, elevated troponin. EXAM: CT ANGIOGRAPHY HEAD AND NECK TECHNIQUE: Multidetector CT imaging of the head and neck was performed using the standard protocol during bolus administration of intravenous contrast. Multiplanar CT image reconstructions and MIPs were obtained to evaluate the vascular anatomy. Carotid stenosis measurements (when applicable) are obtained utilizing NASCET criteria, using  the distal internal carotid diameter as the denominator. CONTRAST:  72mL OMNIPAQUE IOHEXOL 350 MG/ML SOLN COMPARISON:  Prior head CT from 06/11/2013. FINDINGS: CT HEAD FINDINGS Brain: Cerebral volume within normal limits. Few scattered hypodense lesions seen involving the periventricular and deep white matter both cerebral hemispheres, consistent with history of demyelinating disease, similar to previous. No acute intracranial hemorrhage. No acute large vessel territory infarct. No mass lesion, midline shift or mass effect. No hydrocephalus or extra-axial fluid collection. Vascular: No hyperdense vessel. Skull: Scalp soft tissues and calvarium within normal limits. Sinuses: Paranasal sinuses are clear.  No mastoid effusion. Orbits: Globes and orbital soft tissues within normal limits. Review of the MIP images confirms the above findings CTA NECK FINDINGS Aortic arch: Visualized aortic arch of normal caliber with normal 3 vessel morphology. No hemodynamically significant stenosis seen about the origin of the great vessels. Partially visualized subclavian arteries widely patent. Right carotid system: Right common carotid artery patent from its origin to the bifurcation without stenosis or other abnormality. Short-segment atheromatous stenosis of up to 35% at the origin of the right ICA. Right ICA widely patent distally to the skull base without stenosis, dissection or occlusion. Left carotid system: Left common and internal carotid arteries widely patent without stenosis, dissection, or occlusion. Vertebral arteries: Both vertebral arteries arise from the subclavian arteries. Right vertebral artery dominant, with a diffusely hypoplastic left vertebral artery. Both vertebral arteries patent within the neck without stenosis, dissection or occlusion. Skeleton: No acute osseous abnormality. No discrete or worrisome osseous lesions. 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 Other neck: No other  acute soft tissue abnormality within neck. No mass lesion or adenopathy. Upper chest: Visualized upper chest demonstrates no other acute abnormality. Review of the MIP images confirms the above findings CTA HEAD FINDINGS Anterior circulation: Petrous segments widely patent bilaterally. Minor atheromatous change within the cavernous/supraclinoid ICAs without stenosis or other abnormality. A1 segments widely patent. Normal anterior communicating artery complex. Anterior cerebral arteries widely patent to their distal aspects without stenosis. No M1 stenosis or occlusion. Normal MCA bifurcations. Distal MCA branches well perfused and symmetric. Posterior circulation: Dominant right vertebral artery widely patent to the vertebrobasilar junction without stenosis. Patent right PICA. Hypoplastic left vertebral artery largely terminates in PICA, although a tiny branch ascending towards the vertebrobasilar junction. Left PICA patent as well. Basilar diminutive but patent to its distal aspect without stenosis. Superior cerebral arteries patent bilaterally. Right PCA supplied via the basilar. Fetal type origin of the left PCA. Both PCAs well perfused to their distal aspects without stenosis. Venous sinuses: Grossly patent allowing for timing the contrast bolus. Anatomic variants: Fetal type origin left PCA. No intracranial aneurysm. Review of the MIP images confirms the above findings IMPRESSION: CT HEAD IMPRESSION: 1. No acute intracranial abnormality. 2. Scattered hypodensities involving the supratentorial cerebral white matter, consistent with history of demyelinating disease. Overall, appearance is grossly similar as compared to prior CT  from 2014. CTA HEAD AND NECK IMPRESSION: 1. Negative CTA of the head and neck for dissection or other acute vascular abnormality. 2. Short-segment mild 35% stenosis at the origin of the right ICA. 3. Additional minor atheromatous change for age elsewhere about the major arterial  vasculature of the head and neck. No other hemodynamically significant or correctable stenosis. Electronically Signed   By: Rise Mu M.D.   On: 02/24/2020 04:45    Cardiac Studies   Pending echocardiogram and cardiac catheterization today  Patient Profile     64 y.o. female with history of untreated hypertension due to fluctuation and family history of CAD (father required CABG in his 73s) presented with few days history of exertional shortness of breath and chest tightness and found to have elevated troponin.  Head CT negative acute findings.  This was done for dizziness.  Assessment & Plan    1.  Non-STEMI -High-sensitivity troponin [id number].  Currently chest pain and shortness of breath.  Plan cardiac catheterization later today. The patient understands that risks include but are not limited to stroke (1 in 1000), death (1 in 1000), kidney failure [usually temporary] (1 in 500), bleeding (1 in 200), allergic reaction [possibly serious] (1 in 200), and agrees to proceed.  - 02/24/2020: Cholesterol 168; HDL 52; LDL Cholesterol 99; Triglycerides 84; VLDL 17 -Continue aspirin, statin and beta-blocker. - On heparin  2.  Hypertension -Patient is not taking any antihypertensives secondary to fluctuating blood pressure -Started low-dose Toprol-XL   For questions or updates, please contact [facility] Please consult [email or web address] for contact info under        Signed, Manson Passey, PA  02/24/2020, 7:58 AM    Agree with note by Chelsea Aus PA-C  Ms. Henrikson was admitted with a non-STEMI 02/23/2020.  Her only risk factor is family history.  She did receive her second Covid vaccine on Wednesday.  Her troponins rose to approximate 2000.  She has right bundle branch block.  She is on IV heparin.  Her exam is benign.  Plan for left heart cath today. The patient understands that risks included but are not limited to stroke (1 in 1000), death (1 in 1000), kidney failure  [usually temporary] (1 in 500), bleeding (1 in 200), allergic reaction [possibly serious] (1 in 200). The patient understands and agrees to proceed  Runell Gess, M.D., FACP, [facility], Kathryne Eriksson [facility] [address] [place], [place]  [address]  [phone] 02/24/2020 8:12 AM

## 2020-02-24 NOTE — H&P (View-Only) (Signed)
Progress Note  Patient Name: Kayla [name] Date of Encounter: 02/24/2020  Primary Cardiologist: New to Dr. Allyson Sabal   Subjective   Feeling well. No chest pain, sob or palpitations.   Inpatient Medications    Scheduled Meds: . aspirin EC  81 mg Oral Daily  . atorvastatin  80 mg Oral q1800  . metoprolol succinate  12.5 mg Oral Daily   Continuous Infusions: . heparin 700 Units/hr (02/23/20 2358)   PRN Meds: acetaminophen, nitroGLYCERIN, ondansetron (ZOFRAN) IV   Vital Signs    Vitals:   02/24/20 0001 02/24/20 0015 02/24/20 0100 02/24/20 0347  BP: (!) 153/100 (!) 168/101 (!) 149/66 (!) 146/93  Pulse: 72 72 76 75  Resp: 15 15  16   Temp:   98.2 F (36.8 C) 98.2 F (36.8 C)  TempSrc:   Oral Oral  SpO2: 96% 98% 97% 98%  Weight:   66.3 kg   Height:   4' 11.5" (1.511 m)     Intake/Output Summary (Last 24 hours) at 02/24/2020 0758 Last data filed at 02/24/2020 0400 Gross per 24 hour  Intake 0 ml  Output --  Net 0 ml   Last 3 Weights 02/24/2020 02/23/2020 11/27/2014  Weight (lbs) 146 lb 2.6 oz 145 lb 125 lb  Weight (kg) 66.3 kg 65.772 kg 56.7 kg      Telemetry    NSR - Personally Reviewed  ECG    NSR with RBBB - Personally Reviewed  Physical Exam   GEN: No acute distress.   Neck: No JVD Cardiac: RRR, no murmurs, rubs, or gallops.  Respiratory: Clear to auscultation bilaterally. GI: Soft, nontender, non-distended  MS: No edema; No deformity. Neuro:  Nonfocal  Psych: Normal affect   Labs    High Sensitivity Troponin:   Recent Labs  Lab 02/23/20 2125 02/24/20 0106  TROPONINIHS 372* 1,869*      Chemistry Recent Labs  Lab 02/23/20 2125 02/24/20 0106  NA 135 139  K 4.0 4.2  CL 103 107  CO2 22 22  GLUCOSE 123* 108*  BUN 11 9  CREATININE 0.86 0.73  CALCIUM 8.3* 8.5*  PROT 7.1  --   ALBUMIN 3.4*  --   AST 21  --   ALT 13  --   ALKPHOS 71  --   BILITOT 0.4  --   GFRNONAA >60 >60  GFRAA >60 >60  ANIONGAP 10 10     Hematology Recent Labs   Lab 02/23/20 2125 02/24/20 0106  WBC 8.8 10.9*  RBC 4.27 4.28  HGB 13.9 13.4  HCT 41.9 41.0  MCV 98.1 95.8  MCH 32.6 31.3  MCHC 33.2 32.7  RDW 13.1 12.9  PLT 351 359    BNPNo results for input(s): BNP, PROBNP in the last 168 hours.   DDimer  Recent Labs  Lab 02/23/20 2125  DDIMER 0.44     Radiology    CT Angio Head W or Wo Contrast  Result Date: 02/24/2020 CLINICAL DATA:  Initial evaluation for acute neck and jaw pain, elevated troponin. EXAM: CT ANGIOGRAPHY HEAD AND NECK TECHNIQUE: Multidetector CT imaging of the head and neck was performed using the standard protocol during bolus administration of intravenous contrast. Multiplanar CT image reconstructions and MIPs were obtained to evaluate the vascular anatomy. Carotid stenosis measurements (when applicable) are obtained utilizing NASCET criteria, using the distal internal carotid diameter as the denominator. CONTRAST:  38mL OMNIPAQUE IOHEXOL 350 MG/ML SOLN COMPARISON:  Prior head CT from 06/11/2013. FINDINGS: CT HEAD FINDINGS Brain: Cerebral  volume within normal limits. Few scattered hypodense lesions seen involving the periventricular and deep white matter both cerebral hemispheres, consistent with history of demyelinating disease, similar to previous. No acute intracranial hemorrhage. No acute large vessel territory infarct. No mass lesion, midline shift or mass effect. No hydrocephalus or extra-axial fluid collection. Vascular: No hyperdense vessel. Skull: Scalp soft tissues and calvarium within normal limits. Sinuses: Paranasal sinuses are clear.  No mastoid effusion. Orbits: Globes and orbital soft tissues within normal limits. Review of the MIP images confirms the above findings CTA NECK FINDINGS Aortic arch: Visualized aortic arch of normal caliber with normal 3 vessel morphology. No hemodynamically significant stenosis seen about the origin of the great vessels. Partially visualized subclavian arteries widely patent. Right  carotid system: Right common carotid artery patent from its origin to the bifurcation without stenosis or other abnormality. Short-segment atheromatous stenosis of up to 35% at the origin of the right ICA. Right ICA widely patent distally to the skull base without stenosis, dissection or occlusion. Left carotid system: Left common and internal carotid arteries widely patent without stenosis, dissection, or occlusion. Vertebral arteries: Both vertebral arteries arise from the subclavian arteries. Right vertebral artery dominant, with a diffusely hypoplastic left vertebral artery. Both vertebral arteries patent within the neck without stenosis, dissection or occlusion. Skeleton: No acute osseous abnormality. No discrete or worrisome osseous lesions. 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 Other neck: No other acute soft tissue abnormality within neck. No mass lesion or adenopathy. Upper chest: Visualized upper chest demonstrates no other acute abnormality. Review of the MIP images confirms the above findings CTA HEAD FINDINGS Anterior circulation: Petrous segments widely patent bilaterally. Minor atheromatous change within the cavernous/supraclinoid ICAs without stenosis or other abnormality. A1 segments widely patent. Normal anterior communicating artery complex. Anterior cerebral arteries widely patent to their distal aspects without stenosis. No M1 stenosis or occlusion. Normal MCA bifurcations. Distal MCA branches well perfused and symmetric. Posterior circulation: Dominant right vertebral artery widely patent to the vertebrobasilar junction without stenosis. Patent right PICA. Hypoplastic left vertebral artery largely terminates in PICA, although a tiny branch ascending towards the vertebrobasilar junction. Left PICA patent as well. Basilar diminutive but patent to its distal aspect without stenosis. Superior cerebral arteries patent bilaterally. Right PCA supplied via the basilar.  Fetal type origin of the left PCA. Both PCAs well perfused to their distal aspects without stenosis. Venous sinuses: Grossly patent allowing for timing the contrast bolus. Anatomic variants: Fetal type origin left PCA. No intracranial aneurysm. Review of the MIP images confirms the above findings IMPRESSION: CT HEAD IMPRESSION: 1. No acute intracranial abnormality. 2. Scattered hypodensities involving the supratentorial cerebral white matter, consistent with history of demyelinating disease. Overall, appearance is grossly similar as compared to prior CT from 2014. CTA HEAD AND NECK IMPRESSION: 1. Negative CTA of the head and neck for dissection or other acute vascular abnormality. 2. Short-segment mild 35% stenosis at the origin of the right ICA. 3. Additional minor atheromatous change for age elsewhere about the major arterial vasculature of the head and neck. No other hemodynamically significant or correctable stenosis. Electronically Signed   By: Rise Mu M.D.   On: 02/24/2020 04:45   DG Chest 2 View  Result Date: 02/23/2020 CLINICAL DATA:  Shortness of breath, diaphoresis EXAM: CHEST - 2 VIEW COMPARISON:  08/14/2009 FINDINGS: Lungs are clear.  No pleural effusion or pneumothorax. The heart is normal in size. Mild degenerative changes of the visualized thoracolumbar spine. IMPRESSION:  Normal chest radiographs. Electronically Signed   By: Julian Hy M.D.   On: 02/23/2020 22:16   CT Angio Neck W and/or Wo Contrast  Result Date: 02/24/2020 CLINICAL DATA:  Initial evaluation for acute neck and jaw pain, elevated troponin. EXAM: CT ANGIOGRAPHY HEAD AND NECK TECHNIQUE: Multidetector CT imaging of the head and neck was performed using the standard protocol during bolus administration of intravenous contrast. Multiplanar CT image reconstructions and MIPs were obtained to evaluate the vascular anatomy. Carotid stenosis measurements (when applicable) are obtained utilizing NASCET criteria, using  the distal internal carotid diameter as the denominator. CONTRAST:  72mL OMNIPAQUE IOHEXOL 350 MG/ML SOLN COMPARISON:  Prior head CT from 06/11/2013. FINDINGS: CT HEAD FINDINGS Brain: Cerebral volume within normal limits. Few scattered hypodense lesions seen involving the periventricular and deep white matter both cerebral hemispheres, consistent with history of demyelinating disease, similar to previous. No acute intracranial hemorrhage. No acute large vessel territory infarct. No mass lesion, midline shift or mass effect. No hydrocephalus or extra-axial fluid collection. Vascular: No hyperdense vessel. Skull: Scalp soft tissues and calvarium within normal limits. Sinuses: Paranasal sinuses are clear.  No mastoid effusion. Orbits: Globes and orbital soft tissues within normal limits. Review of the MIP images confirms the above findings CTA NECK FINDINGS Aortic arch: Visualized aortic arch of normal caliber with normal 3 vessel morphology. No hemodynamically significant stenosis seen about the origin of the great vessels. Partially visualized subclavian arteries widely patent. Right carotid system: Right common carotid artery patent from its origin to the bifurcation without stenosis or other abnormality. Short-segment atheromatous stenosis of up to 35% at the origin of the right ICA. Right ICA widely patent distally to the skull base without stenosis, dissection or occlusion. Left carotid system: Left common and internal carotid arteries widely patent without stenosis, dissection, or occlusion. Vertebral arteries: Both vertebral arteries arise from the subclavian arteries. Right vertebral artery dominant, with a diffusely hypoplastic left vertebral artery. Both vertebral arteries patent within the neck without stenosis, dissection or occlusion. Skeleton: No acute osseous abnormality. No discrete or worrisome osseous lesions. 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 Other neck: No other  acute soft tissue abnormality within neck. No mass lesion or adenopathy. Upper chest: Visualized upper chest demonstrates no other acute abnormality. Review of the MIP images confirms the above findings CTA HEAD FINDINGS Anterior circulation: Petrous segments widely patent bilaterally. Minor atheromatous change within the cavernous/supraclinoid ICAs without stenosis or other abnormality. A1 segments widely patent. Normal anterior communicating artery complex. Anterior cerebral arteries widely patent to their distal aspects without stenosis. No M1 stenosis or occlusion. Normal MCA bifurcations. Distal MCA branches well perfused and symmetric. Posterior circulation: Dominant right vertebral artery widely patent to the vertebrobasilar junction without stenosis. Patent right PICA. Hypoplastic left vertebral artery largely terminates in PICA, although a tiny branch ascending towards the vertebrobasilar junction. Left PICA patent as well. Basilar diminutive but patent to its distal aspect without stenosis. Superior cerebral arteries patent bilaterally. Right PCA supplied via the basilar. Fetal type origin of the left PCA. Both PCAs well perfused to their distal aspects without stenosis. Venous sinuses: Grossly patent allowing for timing the contrast bolus. Anatomic variants: Fetal type origin left PCA. No intracranial aneurysm. Review of the MIP images confirms the above findings IMPRESSION: CT HEAD IMPRESSION: 1. No acute intracranial abnormality. 2. Scattered hypodensities involving the supratentorial cerebral white matter, consistent with history of demyelinating disease. Overall, appearance is grossly similar as compared to prior CT  from 2014. CTA HEAD AND NECK IMPRESSION: 1. Negative CTA of the head and neck for dissection or other acute vascular abnormality. 2. Short-segment mild 35% stenosis at the origin of the right ICA. 3. Additional minor atheromatous change for age elsewhere about the major arterial  vasculature of the head and neck. No other hemodynamically significant or correctable stenosis. Electronically Signed   By: Rise Mu M.D.   On: 02/24/2020 04:45    Cardiac Studies   Pending echocardiogram and cardiac catheterization today  Patient Profile     64 y.o. female with history of untreated hypertension due to fluctuation and family history of CAD (father required CABG in his 73s) presented with few days history of exertional shortness of breath and chest tightness and found to have elevated troponin.  Head CT negative acute findings.  This was done for dizziness.  Assessment & Plan    1.  Non-STEMI -High-sensitivity troponin 372>>[id number].  Currently chest pain and shortness of breath.  Plan cardiac catheterization later today. The patient understands that risks include but are not limited to stroke (1 in 1000), death (1 in 1000), kidney failure [usually temporary] (1 in 500), bleeding (1 in 200), allergic reaction [possibly serious] (1 in 200), and agrees to proceed.  - 02/24/2020: Cholesterol 168; HDL 52; LDL Cholesterol 99; Triglycerides 84; VLDL 17 -Continue aspirin, statin and beta-blocker. - On heparin  2.  Hypertension -Patient is not taking any antihypertensives secondary to fluctuating blood pressure -Started low-dose Toprol-XL   For questions or updates, please contact [facility] Please consult [email or web address] for contact info under        Signed, Manson Passey, PA  02/24/2020, 7:58 AM    Agree with note by Chelsea Aus PA-C  Ms. Henrikson was admitted with a non-STEMI 02/23/2020.  Her only risk factor is family history.  She did receive her second Covid vaccine on Wednesday.  Her troponins rose to approximate 2000.  She has right bundle branch block.  She is on IV heparin.  Her exam is benign.  Plan for left heart cath today. The patient understands that risks included but are not limited to stroke (1 in 1000), death (1 in 1000), kidney failure  [usually temporary] (1 in 500), bleeding (1 in 200), allergic reaction [possibly serious] (1 in 200). The patient understands and agrees to proceed  Runell Gess, M.D., FACP, [facility], Kathryne Eriksson [facility] [address] [place], [place]  [address]  [phone] 02/24/2020 8:12 AM

## 2020-02-24 NOTE — Plan of Care (Signed)
  Problem: Education: Goal: Knowledge of General Education information will improve Description: Including pain rating scale, medication(s)/side effects and non-pharmacologic comfort measures Outcome: Progressing   Problem: Clinical Measurements: Goal: Ability to maintain clinical measurements within normal limits will improve Outcome: Progressing   Problem: Clinical Measurements: Goal: Will remain free from infection Outcome: Progressing   Problem: Clinical Measurements: Goal: Diagnostic test results will improve Outcome: Progressing   Problem: Clinical Measurements: Goal: Respiratory complications will improve Outcome: Progressing   Problem: Clinical Measurements: Goal: Cardiovascular complication will be avoided Outcome: Progressing   Problem: Nutrition: Goal: Adequate nutrition will be maintained Outcome: Progressing

## 2020-02-24 NOTE — Interval H&P Note (Signed)
History and Physical Interval Note:  02/24/2020 4:51 PM  Kayla [name]  has presented today for cardiac catheterization, with the diagnosis of NSTEMI.  The various methods of treatment have been discussed with the patient and family. After consideration of risks, benefits and other options for treatment, the patient has consented to  Procedure(s): LEFT HEART CATH AND CORONARY ANGIOGRAPHY (N/A) as a surgical intervention.  The patient's history has been reviewed, patient examined, no change in status, stable for surgery.  I have reviewed the patient's chart and labs.  Questions were answered to the patient's satisfaction.    Cath Lab Visit (complete for each Cath Lab visit)  Clinical Evaluation Leading to the Procedure:   ACS: Yes.    Non-ACS:  N/A  Isatu Macinnes

## 2020-02-24 NOTE — Progress Notes (Signed)
Cardiology paged about critical Trop

## 2020-02-24 NOTE — Progress Notes (Signed)
  Echocardiogram 2D Echocardiogram has been performed.  Kayla [name] M 02/24/2020, 11:49 AM

## 2020-02-25 DIAGNOSIS — I5181 Takotsubo syndrome: Secondary | ICD-10-CM | POA: Diagnosis not present

## 2020-02-25 DIAGNOSIS — I519 Heart disease, unspecified: Secondary | ICD-10-CM

## 2020-02-25 DIAGNOSIS — I25110 Atherosclerotic heart disease of native coronary artery with unstable angina pectoris: Secondary | ICD-10-CM | POA: Diagnosis not present

## 2020-02-25 DIAGNOSIS — I214 Non-ST elevation (NSTEMI) myocardial infarction: Secondary | ICD-10-CM | POA: Diagnosis not present

## 2020-02-25 DIAGNOSIS — Z20822 Contact with and (suspected) exposure to covid-19: Secondary | ICD-10-CM | POA: Diagnosis not present

## 2020-02-25 LAB — CBC
HCT: 43.1 % (ref 36.0–46.0)
Hemoglobin: 13.8 g/dL (ref 12.0–15.0)
MCH: 31.7 pg (ref 26.0–34.0)
MCHC: 32 g/dL (ref 30.0–36.0)
MCV: 99.1 fL (ref 80.0–100.0)
Platelets: 315 10*3/uL (ref 150–400)
RBC: 4.35 MIL/uL (ref 3.87–5.11)
RDW: 13.2 % (ref 11.5–15.5)
WBC: 7.9 10*3/uL (ref 4.0–10.5)
nRBC: 0 % (ref 0.0–0.2)

## 2020-02-25 LAB — HEMOGLOBIN A1C
Hgb A1c MFr Bld: 5.6 % (ref 4.8–5.6)
Mean Plasma Glucose: 114 mg/dL

## 2020-02-25 LAB — BASIC METABOLIC PANEL
Anion gap: 9 (ref 5–15)
BUN: 10 mg/dL (ref 8–23)
CO2: 21 mmol/L — ABNORMAL LOW (ref 22–32)
Calcium: 8.3 mg/dL — ABNORMAL LOW (ref 8.9–10.3)
Chloride: 108 mmol/L (ref 98–111)
Creatinine, Ser: 0.76 mg/dL (ref 0.44–1.00)
GFR calc Af Amer: >60 mL/min (ref >60)
GFR calc non Af Amer: >60 mL/min (ref >60)
Glucose, Bld: 110 mg/dL — ABNORMAL HIGH (ref 70–99)
Potassium: 4.2 mmol/L (ref 3.5–5.1)
Sodium: 138 mmol/L (ref 135–145)

## 2020-02-25 MED ORDER — ATORVASTATIN CALCIUM 40 MG PO TABS: 40.0000 mg | ORAL_TABLET | Freq: Every day | ORAL | 6 refills | Status: DC

## 2020-02-25 MED ORDER — NITROGLYCERIN 0.4 MG SL SUBL: 0.4000 mg | SUBLINGUAL_TABLET | SUBLINGUAL | 12 refills | Status: DC | PRN

## 2020-02-25 MED ORDER — METOPROLOL SUCCINATE ER 25 MG PO TB24: 25.0000 mg | ORAL_TABLET | Freq: Every day | ORAL | 6 refills | Status: DC

## 2020-02-25 MED FILL — NITROGLYCERIN 0.4 MG TAB SL: 0.4 | 7 days supply | Qty: 25 | Fill #0

## 2020-02-25 MED FILL — METOPROLOL SUCCINATE ER 25: 25 | 30 days supply | Qty: 30 | Fill #0

## 2020-02-25 MED FILL — ATORVASTATIN CALCIUM 40 MG: 40 | 30 days supply | Qty: 30 | Fill #0

## 2020-02-25 NOTE — Plan of Care (Signed)

## 2020-02-25 NOTE — Progress Notes (Signed)
Pt d/c home with family. PIV removed, discharge information given to pt and husband. Answered pt's questions to satisfaction.

## 2020-02-25 NOTE — Progress Notes (Signed)
CARDIAC REHAB PHASE I   Pt states she has been ambulating independently without issue. Pt given MI book along with low sodium diets. Pt educated on importance of daily weights. Reviewed restrictions and exercise guidelines. Talked a lot with pt about stress management. Pt states a lot of stressors with work and home life. Encouraged pt make time for herself, and get help if needed. Will refer to CRP II [place]. Pt is interested in participating in Virtual Cardiac and Pulmonary Rehab. Pt advised that Virtual Cardiac and Pulmonary Rehab is provided at no cost to the patient.  Checklist:  1. Pt has smart device  ie smartphone and/or ipad for downloading an app  Yes 2. Reliable internet/wifi service    Yes 3. Understands how to use their smartphone and navigate within an app.  Yes  Pt verbalized understanding and is in agreement.  [id number] Reynold Bowen, RN BSN 02/25/2020 9:17 AM

## 2020-02-25 NOTE — Discharge Summary (Addendum)
Discharge Summary    Patient ID: Kayla [name] MRN: [id number]; DOB: 30-Sep-1956  Admit date: 02/23/2020 Discharge date: 02/25/2020  Primary Care Provider: Patient, No Pcp Per  Primary Cardiologist: New to Dr. Gwenlyn Found   Discharge Diagnoses    Active Problems:   NSTEMI (non-ST elevated myocardial infarction) ([place])   HTN   CAD   HLD   Stress induced cardiomyopathy   Diagnostic Studies/Procedures    Echo 02/24/20 1. Left ventricular ejection fraction, by estimation, is 40 to 45%. Left  ventricular ejection fraction by 2D MOD biplane is 42.5 %. The left  ventricle has mildly decreased function. The left ventricle demonstrates  regional wall motion abnormalities (see  scoring diagram/findings for description). There is mild asymmetric left  ventricular hypertrophy of the septal segment. Left ventricular diastolic  parameters are consistent with Grade I diastolic dysfunction (impaired  relaxation). Hypokinesis of the mid  to apical segments with relative sparing of the basal segments.  Concerning for Takotsubo cardiomyopathy . The average left ventricular  global longitudinal strain is -12.5 %.  2. Right ventricular systolic function is normal. The right ventricular  size is normal.  3. The mitral valve is normal in structure. No evidence of mitral valve  regurgitation. No evidence of mitral stenosis.  4. The aortic valve is tricuspid. Aortic valve regurgitation is not  visualized. No aortic stenosis is present.  5. The inferior vena cava is normal in size with greater than 50%  respiratory variability, suggesting right atrial pressure of 3 mmHg.   LEFT HEART CATH AND CORONARY ANGIOGRAPHY  02/24/20  Conclusion  Conclusions: 1. Non-critical coronary artery disease, including 70% ostial D2 stenosis and 20-40% lesions involving the mid LAD on OM2. 2. Moderately reduced left ventricular systolic function with mid and apical anterior, apical, and apical inferior akinesis.  LVEF  35-45%.  Wall motion abnormality is most consistent with stress-induced cardiomyopathy. 3. Normal left ventricular filling pressure. 4. Significant right radial artery vasospasm affecting catheter selection/manipulation.  Consider alternate approach if catheterization is needed in the future.  Recommendation: 1. Medical therapy to prevent progression of coronary artery disease, including statin and aspirin therapy.  If patient has refractory symptoms with objective ischemia in D2 territory, PCI could be considered though intervention could jeopardize the LAD. 2. Optimize evidence-based heart failure therapy; metoprolol succinate should be continued, with addition of ARB tomorrow if renal function and blood pressure tolerate.    Diagnostic Dominance: Right       History of Present Illness     Kayla [name] is a 64 y.o. female with hx of HTN presented with jaw pain and found to have elevated troponin.   For the past few day prior to presentation she noted DOE. She walks about 30 minutes daily and has had increasing shortness of breath during this time lately. However on 02/23/20 she had of neck, jaw, and head pain with DOE. She does reported symptoms of diaphoresis and dizziness. She took aspirin with some improvement in her neck and head pain. Her symptoms however did not resolve, thus she called EMS and was brought to the ED for evaluation.   Her vital signs were significant for systolic blood pressure in the 170s, otherwise the remainder of her vitals were within normal limits. Her laboratory testing was significant for an initial troponin of 372. Her ECG was significant only for RBBB but no acute ST or T wave changes. She was given aspirin and started on a heparin drip.  Hospital Course  Consultants: None  1.  Non-STEMI/ CAD -High-sensitivity troponin 372>>[id number].  Treated with heparin.  Gram showed LV function of 40 to 45%, grade 1 diastolic dysfunction and wall motion abnormality  concerning for Takotsubo cardiomyopathy.  Cardiac cath showed noncritical CAD including 70% ostial D2 and 20 to 40% mid LAD on D2.  Findings consistent with stress-induced cardiomyopathy.  Normal left ventricular filling pressure.  She has radial artery vasospasm.  Recommended medical therapy. - 02/24/2020: Cholesterol 168; HDL 52; LDL Cholesterol 99; Triglycerides 84; VLDL 17 -Continue aspirin, statin and beta-blocker.  2.  Hypertension -Patient is not taking any antihypertensives (was on lopressor) secondary to fluctuating blood pressure and affecting her sleep -Started low-dose Toprol - tolerating well  - blood pressure has been stabilized  3.  Stress induced cardiomyopathy -Appears Euvolemic.  Reports Cat died 3 weeks ago.  She is under a lot of stress since pandemic as she is working from home and taking care of 64 year old mother-in-law in house.   -Started low-dose long-acting beta-blocker -We will plan to add ARB as outpatient (due to fluctuating blood pressure and she will be fasting for Ramadan, religious reason)  4.  Hyperlipidemia -LDL 99 -Start Lipitor 40 mg -Lipid panel and LFTs in 8 weeks.  Did the patient have an acute coronary syndrome (MI, NSTEMI, STEMI, etc) this admission?:  No.   The elevated Troponin was due to the acute medical illness (demand ischemia).   Discharge Vitals Blood pressure 109/81, pulse 73, temperature 97.8 F (36.6 C), temperature source Oral, resp. rate 14, height 4' 11.5" (1.511 m), weight 66.3 kg, SpO2 98 %.  Filed Weights   02/23/20 2108 02/24/20 0100  Weight: 65.8 kg 66.3 kg   Physical Exam  Constitutional: She is oriented to person, place, and time and well-developed, well-nourished, and in no distress.  HENT:  Head: Normocephalic and atraumatic.  Eyes: Pupils are equal, round, and reactive to light. EOM are normal.  Cardiovascular: Normal rate and regular rhythm.  Right radial cath site without hematoma  Pulmonary/Chest: Effort normal  and breath sounds normal.  Abdominal: Soft. Bowel sounds are normal.  Musculoskeletal:        General: Normal range of motion.     Cervical back: Normal range of motion and neck supple.  Neurological: She is alert and oriented to person, place, and time.  Skin: Skin is warm and dry.  Psychiatric: Affect normal.   Labs & Radiologic Studies    CBC Recent Labs    02/23/20 2125 02/23/20 2125 02/24/20 0106 02/25/20 0255  WBC 8.8   < > 10.9* 7.9  NEUTROABS 4.7  --   --   --   HGB 13.9   < > 13.4 13.8  HCT 41.9   < > 41.0 43.1  MCV 98.1   < > 95.8 99.1  PLT 351   < > 359 315   < > = values in this interval not displayed.   Basic Metabolic Panel Recent Labs    40/98/11 0106 02/25/20 0255  NA 139 138  K 4.2 4.2  CL 107 108  CO2 22 21*  GLUCOSE 108* 110*  BUN 9 10  CREATININE 0.73 0.76  CALCIUM 8.5* 8.3*   Liver Function Tests Recent Labs    02/23/20 2125  AST 21  ALT 13  ALKPHOS 71  BILITOT 0.4  PROT 7.1  ALBUMIN 3.4*   High Sensitivity Troponin:   Recent Labs  Lab 02/23/20 2125 02/24/20 0106  TROPONINIHS 372* 1,869*  D-Dimer Recent Labs    02/23/20 2125  DDIMER 0.44   Hemoglobin A1C Recent Labs    02/24/20 0106  HGBA1C 5.6   Fasting Lipid Panel Recent Labs    02/24/20 0106  CHOL 168  HDL 52  LDLCALC 99  TRIG 84  CHOLHDL 3.2   _____________  CT Angio Head W or Wo Contrast  Result Date: 02/24/2020 CLINICAL DATA:  Initial evaluation for acute neck and jaw pain, elevated troponin. EXAM: CT ANGIOGRAPHY HEAD AND NECK TECHNIQUE: Multidetector CT imaging of the head and neck was performed using the standard protocol during bolus administration of intravenous contrast. Multiplanar CT image reconstructions and MIPs were obtained to evaluate the vascular anatomy. Carotid stenosis measurements (when applicable) are obtained utilizing NASCET criteria, using the distal internal carotid diameter as the denominator. CONTRAST:  26mL OMNIPAQUE IOHEXOL 350  MG/ML SOLN COMPARISON:  Prior head CT from 06/11/2013. FINDINGS: CT HEAD FINDINGS Brain: Cerebral volume within normal limits. Few scattered hypodense lesions seen involving the periventricular and deep white matter both cerebral hemispheres, consistent with history of demyelinating disease, similar to previous. No acute intracranial hemorrhage. No acute large vessel territory infarct. No mass lesion, midline shift or mass effect. No hydrocephalus or extra-axial fluid collection. Vascular: No hyperdense vessel. Skull: Scalp soft tissues and calvarium within normal limits. Sinuses: Paranasal sinuses are clear.  No mastoid effusion. Orbits: Globes and orbital soft tissues within normal limits. Review of the MIP images confirms the above findings CTA NECK FINDINGS Aortic arch: Visualized aortic arch of normal caliber with normal 3 vessel morphology. No hemodynamically significant stenosis seen about the origin of the great vessels. Partially visualized subclavian arteries widely patent. Right carotid system: Right common carotid artery patent from its origin to the bifurcation without stenosis or other abnormality. Short-segment atheromatous stenosis of up to 35% at the origin of the right ICA. Right ICA widely patent distally to the skull base without stenosis, dissection or occlusion. Left carotid system: Left common and internal carotid arteries widely patent without stenosis, dissection, or occlusion. Vertebral arteries: Both vertebral arteries arise from the subclavian arteries. Right vertebral artery dominant, with a diffusely hypoplastic left vertebral artery. Both vertebral arteries patent within the neck without stenosis, dissection or occlusion. Skeleton: No acute osseous abnormality. No discrete or worrisome osseous lesions. 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 Other neck: No other acute soft tissue abnormality within neck. No mass lesion or adenopathy. Upper chest: Visualized  upper chest demonstrates no other acute abnormality. Review of the MIP images confirms the above findings CTA HEAD FINDINGS Anterior circulation: Petrous segments widely patent bilaterally. Minor atheromatous change within the cavernous/supraclinoid ICAs without stenosis or other abnormality. A1 segments widely patent. Normal anterior communicating artery complex. Anterior cerebral arteries widely patent to their distal aspects without stenosis. No M1 stenosis or occlusion. Normal MCA bifurcations. Distal MCA branches well perfused and symmetric. Posterior circulation: Dominant right vertebral artery widely patent to the vertebrobasilar junction without stenosis. Patent right PICA. Hypoplastic left vertebral artery largely terminates in PICA, although a tiny branch ascending towards the vertebrobasilar junction. Left PICA patent as well. Basilar diminutive but patent to its distal aspect without stenosis. Superior cerebral arteries patent bilaterally. Right PCA supplied via the basilar. Fetal type origin of the left PCA. Both PCAs well perfused to their distal aspects without stenosis. Venous sinuses: Grossly patent allowing for timing the contrast bolus. Anatomic variants: Fetal type origin left PCA. No intracranial aneurysm. Review of the MIP images confirms  the above findings IMPRESSION: CT HEAD IMPRESSION: 1. No acute intracranial abnormality. 2. Scattered hypodensities involving the supratentorial cerebral white matter, consistent with history of demyelinating disease. Overall, appearance is grossly similar as compared to prior CT from 2014. CTA HEAD AND NECK IMPRESSION: 1. Negative CTA of the head and neck for dissection or other acute vascular abnormality. 2. Short-segment mild 35% stenosis at the origin of the right ICA. 3. Additional minor atheromatous change for age elsewhere about the major arterial vasculature of the head and neck. No other hemodynamically significant or correctable stenosis.  Electronically Signed   By: Rise Mu M.D.   On: 02/24/2020 04:45   DG Chest 2 View  Result Date: 02/23/2020 CLINICAL DATA:  Shortness of breath, diaphoresis EXAM: CHEST - 2 VIEW COMPARISON:  08/14/2009 FINDINGS: Lungs are clear.  No pleural effusion or pneumothorax. The heart is normal in size. Mild degenerative changes of the visualized thoracolumbar spine. IMPRESSION: Normal chest radiographs. Electronically Signed   By: Charline Bills M.D.   On: 02/23/2020 22:16   CT Angio Neck W and/or Wo Contrast  Result Date: 02/24/2020 CLINICAL DATA:  Initial evaluation for acute neck and jaw pain, elevated troponin. EXAM: CT ANGIOGRAPHY HEAD AND NECK TECHNIQUE: Multidetector CT imaging of the head and neck was performed using the standard protocol during bolus administration of intravenous contrast. Multiplanar CT image reconstructions and MIPs were obtained to evaluate the vascular anatomy. Carotid stenosis measurements (when applicable) are obtained utilizing NASCET criteria, using the distal internal carotid diameter as the denominator. CONTRAST:  76mL OMNIPAQUE IOHEXOL 350 MG/ML SOLN COMPARISON:  Prior head CT from 06/11/2013. FINDINGS: CT HEAD FINDINGS Brain: Cerebral volume within normal limits. Few scattered hypodense lesions seen involving the periventricular and deep white matter both cerebral hemispheres, consistent with history of demyelinating disease, similar to previous. No acute intracranial hemorrhage. No acute large vessel territory infarct. No mass lesion, midline shift or mass effect. No hydrocephalus or extra-axial fluid collection. Vascular: No hyperdense vessel. Skull: Scalp soft tissues and calvarium within normal limits. Sinuses: Paranasal sinuses are clear.  No mastoid effusion. Orbits: Globes and orbital soft tissues within normal limits. Review of the MIP images confirms the above findings CTA NECK FINDINGS Aortic arch: Visualized aortic arch of normal caliber with normal 3  vessel morphology. No hemodynamically significant stenosis seen about the origin of the great vessels. Partially visualized subclavian arteries widely patent. Right carotid system: Right common carotid artery patent from its origin to the bifurcation without stenosis or other abnormality. Short-segment atheromatous stenosis of up to 35% at the origin of the right ICA. Right ICA widely patent distally to the skull base without stenosis, dissection or occlusion. Left carotid system: Left common and internal carotid arteries widely patent without stenosis, dissection, or occlusion. Vertebral arteries: Both vertebral arteries arise from the subclavian arteries. Right vertebral artery dominant, with a diffusely hypoplastic left vertebral artery. Both vertebral arteries patent within the neck without stenosis, dissection or occlusion. Skeleton: No acute osseous abnormality. No discrete or worrisome osseous lesions. 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 Other neck: No other acute soft tissue abnormality within neck. No mass lesion or adenopathy. Upper chest: Visualized upper chest demonstrates no other acute abnormality. Review of the MIP images confirms the above findings CTA HEAD FINDINGS Anterior circulation: Petrous segments widely patent bilaterally. Minor atheromatous change within the cavernous/supraclinoid ICAs without stenosis or other abnormality. A1 segments widely patent. Normal anterior communicating artery complex. Anterior cerebral arteries widely patent to  their distal aspects without stenosis. No M1 stenosis or occlusion. Normal MCA bifurcations. Distal MCA branches well perfused and symmetric. Posterior circulation: Dominant right vertebral artery widely patent to the vertebrobasilar junction without stenosis. Patent right PICA. Hypoplastic left vertebral artery largely terminates in PICA, although a tiny branch ascending towards the vertebrobasilar junction. Left PICA patent  as well. Basilar diminutive but patent to its distal aspect without stenosis. Superior cerebral arteries patent bilaterally. Right PCA supplied via the basilar. Fetal type origin of the left PCA. Both PCAs well perfused to their distal aspects without stenosis. Venous sinuses: Grossly patent allowing for timing the contrast bolus. Anatomic variants: Fetal type origin left PCA. No intracranial aneurysm. Review of the MIP images confirms the above findings IMPRESSION: CT HEAD IMPRESSION: 1. No acute intracranial abnormality. 2. Scattered hypodensities involving the supratentorial cerebral white matter, consistent with history of demyelinating disease. Overall, appearance is grossly similar as compared to prior CT from 2014. CTA HEAD AND NECK IMPRESSION: 1. Negative CTA of the head and neck for dissection or other acute vascular abnormality. 2. Short-segment mild 35% stenosis at the origin of the right ICA. 3. Additional minor atheromatous change for age elsewhere about the major arterial vasculature of the head and neck. No other hemodynamically significant or correctable stenosis. Electronically Signed   By: Rise Mu M.D.   On: 02/24/2020 04:45   CARDIAC CATHETERIZATION  Result Date: 02/24/2020 Conclusions: 1. Non-critical coronary artery disease, including 70% ostial D2 stenosis and 20-40% lesions involving the mid LAD on OM2. 2. Moderately reduced left ventricular systolic function with mid and apical anterior, apical, and apical inferior akinesis.  LVEF 35-45%.  Wall motion abnormality is most consistent with stress-induced cardiomyopathy. 3. Normal left ventricular filling pressure. 4. Significant right radial artery vasospasm affecting catheter selection/manipulation.  Consider alternate approach if catheterization is needed in the future. Recommendation: 1. Medical therapy to prevent progression of coronary artery disease, including statin and aspirin therapy.  If patient has refractory symptoms  with objective ischemia in D2 territory, PCI could be considered though intervention could jeopardize the LAD. 2. Optimize evidence-based heart failure therapy; metoprolol succinate should be continued, with addition of ARB tomorrow if renal function and blood pressure tolerate. Yvonne Kendall, MD [facility]   ECHOCARDIOGRAM COMPLETE  Result Date: 02/24/2020    ECHOCARDIOGRAM REPORT   Patient Name:   Kayla [name] Date of Exam: 02/24/2020 Medical Rec #:  [id number]     Height:       59.5 in Accession #:    [id number]    Weight:       146.2 lb Date of Birth:  06-09-1956    BSA:          1.624 m Patient Age:    63 years      BP:           145/98 mmHg Patient Gender: F             HR:           71 bpm. Exam Location:  Inpatient Procedure: 2D Echo and Strain Analysis Indications:    Acute myocardial infarction 410  History:        Patient has prior history of Echocardiogram examinations, most                 recent 02/03/2009. Risk Factors:Hypertension.  Sonographer:    Leta Jungling RDCS Referring Phys: [id number] ANTHONY PHILIP CARNICELLI IMPRESSIONS  1. Left ventricular ejection fraction, by estimation, is 40  to 45%. Left ventricular ejection fraction by 2D MOD biplane is 42.5 %. The left ventricle has mildly decreased function. The left ventricle demonstrates regional wall motion abnormalities (see  scoring diagram/findings for description). There is mild asymmetric left ventricular hypertrophy of the septal segment. Left ventricular diastolic parameters are consistent with Grade I diastolic dysfunction (impaired relaxation). Hypokinesis of the mid  to apical segments with relative sparing of the basal segments. Concerning for Takotsubo cardiomyopathy . The average left ventricular global longitudinal strain is -12.5 %.  2. Right ventricular systolic function is normal. The right ventricular size is normal.  3. The mitral valve is normal in structure. No evidence of mitral valve regurgitation. No evidence of  mitral stenosis.  4. The aortic valve is tricuspid. Aortic valve regurgitation is not visualized. No aortic stenosis is present.  5. The inferior vena cava is normal in size with greater than 50% respiratory variability, suggesting right atrial pressure of 3 mmHg. FINDINGS  Left Ventricle: Left ventricular ejection fraction, by estimation, is 40 to 45%. Left ventricular ejection fraction by 2D MOD biplane is 42.5 %. The left ventricle has mildly decreased function. The left ventricle demonstrates regional wall motion abnormalities. The average left ventricular global longitudinal strain is -12.5 %. The left ventricular internal cavity size was normal in size. There is mild asymmetric left ventricular hypertrophy of the septal segment. Left ventricular diastolic parameters are consistent with Grade I diastolic dysfunction (impaired relaxation). Indeterminate filling pressures.  LV Wall Scoring: The mid and distal anterior wall, mid and distal lateral wall, mid and distal anterior septum, and entire apex are hypokinetic. The antero-lateral wall, inferior wall, basal anteroseptal segment, basal inferolateral segment, mid inferoseptal segment, basal anterior segment, and basal inferoseptal segment are normal. Hypokinesis of the mid to apical segments with relative sparing of the basal segments. Concerning for Takotsubo cardiomyopathy. Right Ventricle: The right ventricular size is normal. No increase in right ventricular wall thickness. Right ventricular systolic function is normal. Left Atrium: Left atrial size was normal in size. Right Atrium: Right atrial size was normal in size. Pericardium: There is no evidence of pericardial effusion. Mitral Valve: The mitral valve is normal in structure. Normal mobility of the mitral valve leaflets. No evidence of mitral valve regurgitation. No evidence of mitral valve stenosis. Tricuspid Valve: The tricuspid valve is normal in structure. Tricuspid valve regurgitation is trivial.  No evidence of tricuspid stenosis. Aortic Valve: The aortic valve is tricuspid. Aortic valve regurgitation is not visualized. No aortic stenosis is present. Pulmonic Valve: The pulmonic valve was normal in structure. Pulmonic valve regurgitation is not visualized. No evidence of pulmonic stenosis. Aorta: The aortic root is normal in size and structure. Venous: The inferior vena cava is normal in size with greater than 50% respiratory variability, suggesting right atrial pressure of 3 mmHg. IAS/Shunts: No atrial level shunt detected by color flow Doppler.  LEFT VENTRICLE PLAX 2D                        Biplane EF (MOD) LVIDd:         3.60 cm         LV Biplane EF:   Left LVIDs:         2.30 cm                          ventricular LV PW:         1.00 cm  ejection LV IVS:        1.10 cm                          fraction by LVOT diam:     1.40 cm                          2D MOD LV SV:         23                               biplane is LV SV Index:   14                               42.5 %. LVOT Area:     1.54 cm                                Diastology                                LV e' lateral:   4.68 cm/s LV Volumes (MOD)               LV E/e' lateral: 9.3 LV vol d, MOD    87.7 ml       LV e' medial:    4.68 cm/s A2C:                           LV E/e' medial:  9.3 LV vol d, MOD    84.2 ml A4C:                           2D LV vol s, MOD    52.2 ml       Longitudinal A2C:                           Strain LV vol s, MOD    46.1 ml       2D Strain GLS  -13.9 % A4C:                           (A2C): LV SV MOD A2C:   35.5 ml       2D Strain GLS  -13.0 % LV SV MOD A4C:   84.2 ml       (A3C): LV SV MOD BP:    36.3 ml       2D Strain GLS  -10.7 %                                (A4C):                                2D Strain GLS  -12.5 %                                Avg: RIGHT VENTRICLE RV S  prime:     10.90 cm/s TAPSE (M-mode): 1.4 cm LEFT ATRIUM             Index LA diam:        3.20 cm 1.97 cm/m  LA Vol (A2C):   20.0 ml 12.32 ml/m LA Vol (A4C):   20.6 ml 12.69 ml/m LA Biplane Vol: 20.3 ml 12.50 ml/m  AORTIC VALVE LVOT Vmax:   73.70 cm/s LVOT Vmean:  49.300 cm/s LVOT VTI:    0.152 m  AORTA Ao Root diam: 2.20 cm Ao Asc diam:  2.80 cm MITRAL VALVE MV Area (PHT): 3.42 cm    SHUNTS MV Decel Time: 222 msec    Systemic VTI:  0.15 m MV E velocity: 43.70 cm/s  Systemic Diam: 1.40 cm MV A velocity: 88.30 cm/s MV E/A ratio:  0.49 Chilton Si MD Electronically signed by Chilton Si MD Signature Date/Time: 02/24/2020/2:07:04 PM    Final    Disposition   Pt is being discharged home today in good condition.  Follow-up Plans & Appointments    Follow-up Information    Ronney Asters, NP. Go on 03/12/2020.   Specialty: Cardiology Why: @9 :45am for hospital follow up with Dr. Hazle Coca PA/NP Contact information: [address] [place] [address] [phone]          Discharge Instructions    Diet - low sodium heart healthy   Complete by: As directed    Discharge instructions   Complete by: As directed    NO HEAVY LIFTING (>10lbs) X 2 WEEKS. NO SEXUAL ACTIVITY X 2 WEEKS. NO DRIVING X 3 DAYS.  NO SOAKING BATHS, HOT TUBS, POOLS, ETC., X 7 DAYS.   Increase activity slowly   Complete by: As directed       Discharge Medications   Allergies as of 02/25/2020      Reactions   Beta Adrenergic Blockers Shortness Of Breath   Unnamed beta blocker (name not recalled by the patient) = shortness of breath   Percodan [oxycodone-aspirin] Nausea And Vomiting   Lisinopril Other (See Comments)   Patient does not recall the reaction   Pork-derived Products Other (See Comments)   Religious reasons = No pork   Nickel Itching, Rash      Medication List    STOP taking these medications   metoprolol tartrate 25 MG tablet Commonly known as: LOPRESSOR     TAKE these medications   aspirin EC 81 MG tablet Take 81 mg by mouth as needed (for chest pain/sudden onset of  "heart-related" symptoms).   atorvastatin 40 MG tablet Commonly known as: LIPITOR Take 1 tablet (40 mg total) by mouth daily at 6 PM.   dimenhyDRINATE 50 MG tablet Commonly known as: DRAMAMINE Take 25 mg by mouth at bedtime.   ibuprofen 200 MG tablet Commonly known as: ADVIL Take 200 mg by mouth every 6 (six) hours as needed for headache or mild pain.   metoprolol succinate 25 MG 24 hr tablet Commonly known as: TOPROL-XL Take 1 tablet (25 mg total) by mouth daily.   nitroGLYCERIN 0.4 MG SL tablet Commonly known as: NITROSTAT Place 1 tablet (0.4 mg total) under the tongue every 5 (five) minutes x 3 doses as needed for chest pain.   One-A-Day Womens 50 Plus Tabs Take 1 tablet by mouth daily with breakfast.   Probiotic Caps Take 1 capsule by mouth daily after breakfast.   TURMERIC PO Take 1 capsule by mouth daily with breakfast.   Vitamin D3 50  MCG (2000 UT) Tabs Take 2,000 Units by mouth daily with breakfast.          Outstanding Labs/Studies  Consider OP f/u labs 6-8 weeks given statin initiation this admission.  Duration of Discharge Encounter   Greater than 30 minutes including physician time.  SignedManson Passey, PA 02/25/2020, 8:38 AM   Agree with note by Chelsea Aus PA-C  Status post left heart cath via right radial approach by Dr. Okey Dupre yesterday revealing noncritical CAD with left ventriculography consistent with stress cardiomyopathy (Takotsubo syndrome).  Her EF is in the 40 to 45% range.  She is pain-free on appropriate medications.  Her exam is benign.  She stable for discharge home today with TOC 7 followed by return office visit with me in 3 to 4 weeks.  Runell Gess, M.D., FACP, [facility], [facility] [address] [place], [place]  [address]  [phone] 02/25/2020 10:02 AM

## 2020-02-25 NOTE — Plan of Care (Signed)
  Problem: Education: Goal: Knowledge of General Education information will improve Description: Including pain rating scale, medication(s)/side effects and non-pharmacologic comfort measures Outcome: Progressing   Problem: Clinical Measurements: Goal: Ability to maintain clinical measurements within normal limits will improve Outcome: Progressing   Problem: Clinical Measurements: Goal: Will remain free from infection Outcome: Progressing   Problem: Clinical Measurements: Goal: Diagnostic test results will improve Outcome: Progressing   Problem: Clinical Measurements: Goal: Cardiovascular complication will be avoided Outcome: Progressing   Problem: Pain Managment: Goal: General experience of comfort will improve Outcome: Progressing   Problem: Safety: Goal: Ability to remain free from injury will improve Outcome: Progressing   Problem: Skin Integrity: Goal: Risk for impaired skin integrity will decrease Outcome: Progressing

## 2020-02-26 ENCOUNTER — Telehealth (HOSPITAL_COMMUNITY): Payer: Self-pay

## 2020-02-26 LAB — HEMOGLOBIN A1C
Hgb A1c MFr Bld: 5.5 % (ref 4.8–5.6)
Mean Plasma Glucose: 111 mg/dL

## 2020-02-26 NOTE — Telephone Encounter (Signed)
Pt returned CR phone call and stated she is doing fine and will be returning back to work soon. Not interested in CR at this time.  Closed referral

## 2020-02-26 NOTE — Telephone Encounter (Signed)
Attempted to call patient in regards to Cardiac Rehab - LM on VM 

## 2020-02-26 NOTE — Telephone Encounter (Signed)
Pt insurance is active and benefits verified through [place]. Co-pay $0.00, DED $1,000.00/$0.00 met, out of pocket $3,000.00/$0.00 met, co-insurance 15%. No pre-authorization required. Passport, 02/26/20 @ 1:36PM, [phone]  Will contact patient to see if she is interested in the Cardiac Rehab Program. If interested, patient will need to complete follow up appt. Once completed, patient will be contacted for scheduling upon review by the RN Navigator.

## 2020-03-11 NOTE — Progress Notes (Signed)
Cardiology Clinic Note   Patient Name: Kayla [name] Date of Encounter: 03/12/2020  Primary Care Provider:  Maurice Small, MD Primary Cardiologist:  Nanetta Batty, MD  Patient Profile    Kayla Sleeper.  [name] 64 year old female presents to the clinic today for follow-up evaluation after her NSTEMI/stress-induced cardiomyopathy.  Past Medical History    Past Medical History:  Diagnosis Date  . CAD (coronary artery disease), native coronary artery    a. cath 02/2020: 35%mLAD and 70% dig >> medical therapy   . Hyperlipidemia LDL goal <70   . Hypertension   . Stress-induced cardiomyopathy    Past Surgical History:  Procedure Laterality Date  . LEFT HEART CATH AND CORONARY ANGIOGRAPHY N/A 02/24/2020   Procedure: LEFT HEART CATH AND CORONARY ANGIOGRAPHY;  Surgeon: Yvonne Kendall, MD;  Location: [facility] INVASIVE [facility];  Service: Cardiovascular;  Laterality: N/A;    Allergies  Allergies  Allergen Reactions  . Beta Adrenergic Blockers Shortness Of Breath    Unnamed beta blocker (name not recalled by the patient) = shortness of breath  . Percodan [Oxycodone-Aspirin] Nausea And Vomiting  . Lisinopril Other (See Comments)    Patient does not recall the reaction  . Pork-Derived Products Other (See Comments)    Religious reasons = No pork  . Nickel Itching and Rash    History of Present Illness    She presented to the emergency department on 02/23/2020 with jaw pain and elevated troponins.  Over the prior few days she has noticed increased DOE.  She typically would walk 30 minutes daily but had noticed increased DOE.  She contacted EMS and was transported to [facility] ED for further evaluation.  Her systolic blood pressures were in the 170s.  Her initial troponin was 372.  EKG showed a RBBB but now ST or T wave deviation.  She was given ASA and started on IV heparin.  She underwent cardiac catheterization on 02/24/2020.  It showed noncritical CAD, 70% ostial D2 stenosis, 20-40% mid LAD stenosis on  OM 2.  LVEF 35-40% consistent with stress-induced cardiomyopathy.  She also had significant vasospasm via right radial approach.  Alternate approach was recommended if further cardiac catheterizations are needed.  Echocardiogram 02/24/2020 showed an EF of 40-45%, G1 DD, findings consistent with Takotsubo cardiomyopathy and no significant valvular abnormalities.  She presents the clinic today for further evaluation and states her blood pressures have been low in the mornings.  90s over 60s.  She states she feels better as the day goes on and her blood pressure normalizes.  Her mother-in-law is living at her house and some increasing her stress.  Just before she presented to the emergency department her mother-in-law fell and had a spine fracture.  She is currently observing Ramadan and eating twice per day.  Her mother-in-law is currently in a rehab facility.  I will have her continue to increase her physical activity slowly, eat a heart healthy diet, plan to add ARB in 3 weeks if blood pressure will allow.  Follow-up in 3 weeks.  Today she denies chest pain, shortness of breath, lower extremity edema, fatigue, palpitations, melena, hematuria, hemoptysis, diaphoresis, weakness, presyncope, syncope, orthopnea, and PND.     Home Medications    Prior to Admission medications   Medication Sig Start Date End Date Taking? Authorizing Provider  aspirin EC 81 MG tablet Take 81 mg by mouth as needed (for chest pain/sudden onset of "heart-related" symptoms).    [provider]  atorvastatin (LIPITOR) 40 MG tablet  Take 1 tablet (40 mg total) by mouth daily at 6 PM. 02/25/20   Bhagat, [place] Neighbors, PA  Cholecalciferol (VITAMIN D3) 50 MCG (2000 UT) TABS Take 2,000 Units by mouth daily with breakfast.    [provider]  dimenhyDRINATE (DRAMAMINE) 50 MG tablet Take 25 mg by mouth at bedtime.    [provider]  ibuprofen (ADVIL) 200 MG tablet Take 200 mg by mouth every 6 (six) hours as  needed for headache or mild pain.    [provider]  metoprolol succinate (TOPROL-XL) 25 MG 24 hr tablet Take 1 tablet (25 mg total) by mouth daily. 02/25/20   Bhagat, Sharrell Ku, PA  Multiple Vitamins-Minerals (ONE-A-DAY WOMENS 50 PLUS) TABS Take 1 tablet by mouth daily with breakfast.    [provider]  nitroGLYCERIN (NITROSTAT) 0.4 MG SL tablet Place 1 tablet (0.4 mg total) under the tongue every 5 (five) minutes x 3 doses as needed for chest pain. 02/25/20   Bhagat, Sharrell Ku, PA  Probiotic CAPS Take 1 capsule by mouth daily after breakfast.    [provider]  TURMERIC PO Take 1 capsule by mouth daily with breakfast.    [provider]    Family History    Family History  Problem Relation Age of Onset  . Heart disease Father    She indicated that her father is alive.  Social History    Social History   Socioeconomic History  . Marital status: Married    Spouse name: Not on file  . Number of children: Not on file  . Years of education: Not on file  . Highest education level: Not on file  Occupational History  . Not on file  Tobacco Use  . Smoking status: Never Smoker  Substance and Sexual Activity  . Alcohol use: Not on file  . Drug use: Not on file  . Sexual activity: Not on file  Other Topics Concern  . Not on file  Social History Narrative  . Not on file   Social Determinants of Health   Financial Resource Strain:   . Difficulty of Paying Living Expenses:   Food Insecurity:   . Worried About Programme researcher, broadcasting/film/video in the Last Year:   . Barista in the Last Year:   Transportation Needs:   . Freight forwarder (Medical):   Marland Kitchen Lack of Transportation (Non-Medical):   Physical Activity:   . Days of Exercise per Week:   . Minutes of Exercise per Session:   Stress:   . Feeling of Stress :   Social Connections:   . Frequency of Communication with Friends and Family:   . Frequency of Social Gatherings with Friends and  Family:   . Attends Religious Services:   . Active Member of Clubs or Organizations:   . Attends Banker Meetings:   Marland Kitchen Marital Status:   Intimate Partner Violence:   . Fear of Current or Ex-Partner:   . Emotionally Abused:   Marland Kitchen Physically Abused:   . Sexually Abused:      Review of Systems    General:  No chills, fever, night sweats or weight changes.  Cardiovascular:  No chest pain, dyspnea on exertion, edema, orthopnea, palpitations, paroxysmal nocturnal dyspnea. Dermatological: No rash, lesions/masses Respiratory: No cough, dyspnea Urologic: No hematuria, dysuria Abdominal:   No nausea, vomiting, diarrhea, bright red blood per rectum, melena, or hematemesis Neurologic:  No visual changes, wkns, changes in mental status. All other systems reviewed and are  otherwise negative except as noted above.  Physical Exam    VS:  BP 100/60   Pulse (!) 58   Ht 4' 11.5" (1.511 m)   Wt 143 lb 6.4 oz (65 kg)   BMI 28.48 kg/m  , BMI Body mass index is 28.48 kg/m. GEN: Well nourished, well developed, in no acute distress. HEENT: normal. Neck: Supple, no JVD, carotid bruits, or masses. Cardiac: RRR, no murmurs, rubs, or gallops. No clubbing, cyanosis, edema.  Radials/DP/PT 2+ and equal bilaterally.  Respiratory:  Respirations regular and unlabored, clear to auscultation bilaterally. GI: Soft, nontender, nondistended, BS + x 4. MS: no deformity or atrophy. Skin: warm and dry, no rash. Neuro:  Strength and sensation are intact. Psych: Normal affect.  Accessory Clinical Findings    ECG personally reviewed by me today-none today.  Echocardiogram 02/24/2020 IMPRESSIONS    1. Left ventricular ejection fraction, by estimation, is 40 to 45%. Left  ventricular ejection fraction by 2D MOD biplane is 42.5 %. The left  ventricle has mildly decreased function. The left ventricle demonstrates  regional wall motion abnormalities (see  scoring diagram/findings for description).  There is mild asymmetric left  ventricular hypertrophy of the septal segment. Left ventricular diastolic  parameters are consistent with Grade I diastolic dysfunction (impaired  relaxation). Hypokinesis of the mid  to apical segments with relative sparing of the basal segments.  Concerning for Takotsubo cardiomyopathy . The average left ventricular  global longitudinal strain is -12.5 %.  2. Right ventricular systolic function is normal. The right ventricular  size is normal.  3. The mitral valve is normal in structure. No evidence of mitral valve  regurgitation. No evidence of mitral stenosis.  4. The aortic valve is tricuspid. Aortic valve regurgitation is not  visualized. No aortic stenosis is present.  5. The inferior vena cava is normal in size with greater than 50%  respiratory variability, suggesting right atrial pressure of 3 mmHg.    Cardiac catheterization 02/24/2020 1. Non-critical coronary artery disease, including 70% ostial D2 stenosis and 20-40% lesions involving the mid LAD on OM2. 2. Moderately reduced left ventricular systolic function with mid and apical anterior, apical, and apical inferior akinesis.  LVEF 35-45%.  Wall motion abnormality is most consistent with stress-induced cardiomyopathy. 3. Normal left ventricular filling pressure. 4. Significant right radial artery vasospasm affecting catheter selection/manipulation.  Consider alternate approach if catheterization is needed in the future.  Recommendation: 1. Medical therapy to prevent progression of coronary artery disease, including statin and aspirin therapy.  If patient has refractory symptoms with objective ischemia in D2 territory, PCI could be considered though intervention could jeopardize the LAD. 2. Optimize evidence-based heart failure therapy; metoprolol succinate should be continued, with addition of ARB tomorrow if renal function and blood pressure tolerate.  Diagnostic Dominance:  Right  Intervention       Assessment & Plan   1.  Stress-induced cardiomyopathy-no chest pain today.EKG showed a RBBB but now ST or T wave deviation.  She was given ASA and started on IV heparin.  She underwent cardiac catheterization on 02/24/2020.  It showed noncritical CAD, 70% ostial D2 stenosis, 20-40% mid LAD stenosis on OM 2.  LVEF 35-40% consistent with stress-induced cardiomyopathy.  She also had significant vasospasm via right radial approach.  Alternate approach was recommended if further cardiac catheterizations are needed.  Echocardiogram 02/24/2020 showed an EF of 40-45%, G1 DD, findings consistent with Takotsubo cardiomyopathy and no significant valvular abnormalities. Continue current medical therapy Plan to start valsartan  80 mg  daily if blood pressure will allow in 3 weeks. Heart healthy low-sodium diet Increase physical activity as tolerated   Essential hypertension-BP today 110/60 Continue current medical therapy Heart healthy low-sodium diet-salty 6 given Increase physical activity as tolerated  Hyperlipidemia-LDL 99 on 02/24/2020 Continue atorvastatin 40 mg Follow-up lipid panel and LFTs 6 weeks.  Disposition: Follow-up with me in 3 weeks and Dr. Gwenlyn Found in  3 months.  Jossie Ng. Harvest Stanco NP-C    03/12/2020, 10:06 AM [place] [address] [address] Office [phone] Fax [phone]

## 2020-03-12 ENCOUNTER — Other Ambulatory Visit: Payer: Self-pay

## 2020-03-12 ENCOUNTER — Ambulatory Visit: Payer: BC Managed Care – PPO | Admitting: General Practice

## 2020-03-12 ENCOUNTER — Encounter: Payer: Self-pay | Admitting: General Practice

## 2020-03-12 VITALS — BP 100/60 | HR 58 | Ht 59.5 in | Wt 143.4 lb

## 2020-03-12 DIAGNOSIS — I5181 Takotsubo syndrome: Secondary | ICD-10-CM

## 2020-03-12 DIAGNOSIS — E7800 Pure hypercholesterolemia, unspecified: Secondary | ICD-10-CM | POA: Diagnosis not present

## 2020-03-12 DIAGNOSIS — I10 Essential (primary) hypertension: Secondary | ICD-10-CM

## 2020-03-12 NOTE — Patient Instructions (Signed)
Medication Instructions:  Your physician recommends that you continue on your current medications as directed. Please refer to the Current Medication list given to you today.  *If you need a refill on your cardiac medications before your next appointment, please call your pharmacy*   Follow-Up: At [facility], you and your health needs are our priority.  As part of our continuing mission to provide you with exceptional heart care, we have created designated Provider Care Teams.  These Care Teams include your primary Cardiologist (physician) and Advanced Practice Providers (APPs -  Physician Assistants and Nurse Practitioners) who all work together to provide you with the care you need, when you need it.  We recommend signing up for the patient portal called "MyChart".  Sign up information is provided on this After Visit Summary.  MyChart is used to connect with patients for Virtual Visits (Telemedicine).  Patients are able to view lab/test results, encounter notes, upcoming appointments, etc.  Non-urgent messages can be sent to your provider as well.   To learn more about what you can do with MyChart, go to [email or web address].    Your next appointment:   3 week(s) in person with Edd Fabian, NP and 3 months in person with Dr. Allyson Sabal.  The format for your next appointment:   In Person   Other Instructions Increase physical activity as tolerated.  Please review the salty six sheet given to you today.   Mindfulness-Based Stress Reduction Mindfulness-based stress reduction (MBSR) is a program that helps people learn to practice mindfulness. Mindfulness is the practice of intentionally paying attention to the present moment. It can be learned and practiced through techniques such as education, breathing exercises, meditation, and yoga. MBSR includes several mindfulness techniques in one program. MBSR works best when you understand the treatment, are willing to try new things, and can  commit to spending time practicing what you learn. MBSR training may include learning about:  How your emotions, thoughts, and reactions affect your body.  New ways to respond to things that cause negative thoughts to start (triggers).  How to notice your thoughts and let go of them.  Practicing awareness of everyday things that you normally do without thinking.  The techniques and goals of different types of meditation. What are the benefits of MBSR? MBSR can have many benefits, which include helping you to:  Develop self-awareness. This refers to knowing and understanding yourself.  Learn skills and attitudes that help you to participate in your own health care.  Learn new ways to care for yourself.  Be more accepting about how things are, and let things go.  Be less judgmental and approach things with an open mind.  Be patient with yourself and trust yourself more. MBSR has also been shown to:  Reduce negative emotions, such as depression and anxiety.  Improve memory and focus.  Change how you sense and approach pain.  Boost your body's ability to fight infections.  Help you connect better with other people.  Improve your sense of well-being. Follow these instructions at home:   Find a local in-person or online MBSR program.  Set aside some time regularly for mindfulness practice.  Find a mindfulness practice that works best for you. This may include one or more of the following: ? Meditation. Meditation involves focusing your mind on a certain thought or activity. ? Breathing awareness exercises. These help you to stay present by focusing on your breath. ? Body scan. For this practice, you lie down and pay  attention to each part of your body from head to toe. You can identify tension and soreness and intentionally relax parts of your body. ? Yoga. Yoga involves stretching and breathing, and it can improve your ability to move and be flexible. It can also provide  an experience of testing your body's limits, which can help you release stress. ? Mindful eating. This way of eating involves focusing on the taste, texture, color, and smell of each bite of food. Because this slows down eating and helps you feel full sooner, it can be an important part of a weight-loss plan.  Find a podcast or recording that provides guidance for breathing awareness, body scan, or meditation exercises. You can listen to these any time when you have a free moment to rest without distractions.  Follow your treatment plan as told by your health care provider. This may include taking regular medicines and making changes to your diet or lifestyle as recommended. How to practice mindfulness To do a basic awareness exercise:  Find a comfortable place to sit.  Pay attention to the present moment. Observe your thoughts, feelings, and surroundings just as they are.  Avoid placing judgment on yourself, your feelings, or your surroundings. Make note of any judgment that comes up, and let it go.  Your mind may wander, and that is okay. Make note of when your thoughts drift, and return your attention to the present moment. To do basic mindfulness meditation:  Find a comfortable place to sit. This may include a stable chair or a firm floor cushion. ? Sit upright with your back straight. Let your arms fall next to your side with your hands resting on your legs. ? If sitting in a chair, rest your feet flat on the floor. ? If sitting on a cushion, cross your legs in front of you.  Keep your head in a neutral position with your chin dropped slightly. Relax your jaw and rest the tip of your tongue on the roof of your mouth. Drop your gaze to the floor. You can close your eyes if you like.  Breathe normally and pay attention to your breath. Feel the air moving in and out of your nose. Feel your belly expanding and relaxing with each breath.  Your mind may wander, and that is okay. Make note  of when your thoughts drift, and return your attention to your breath.  Avoid placing judgment on yourself, your feelings, or your surroundings. Make note of any judgment or feelings that come up, let them go, and bring your attention back to your breath.  When you are ready, lift your gaze or open your eyes. Pay attention to how your body feels after the meditation. Where to find more information You can find more information about MBSR from:  Your health care provider.  Community-based meditation centers or programs.  Programs offered near you. Summary  Mindfulness-based stress reduction (MBSR) is a program that teaches you how to intentionally pay attention to the present moment. It is used with other treatments to help you cope better with daily stress, emotions, and pain.  MBSR focuses on developing self-awareness, which allows you to respond to life stress without judgment or negative emotions.  MBSR programs may involve learning different mindfulness practices, such as breathing exercises, meditation, yoga, body scan, or mindful eating. Find a mindfulness practice that works best for you, and set aside time for it on a regular basis. This information is not intended to replace advice given to  you by your health care provider. Make sure you discuss any questions you have with your health care provider. Document Revised: 10/13/2017 Document Reviewed: 03/09/2017 Elsevier Patient Education  [place].

## 2020-04-01 NOTE — Progress Notes (Signed)
Cardiology Clinic Note   Patient Name: Kayla [name] Date of Encounter: 04/03/2020  Primary Care Provider:  Maurice Small, MD Primary Cardiologist:  Nanetta Batty, MD  Patient Profile    Kayla [name].  Kayla [name] 64 year old female presents to the clinic today for follow-up evaluation after her NSTEMI/stress-induced cardiomyopathy.  Past Medical History    Past Medical History:  Diagnosis Date  . CAD (coronary artery disease), native coronary artery    a. cath 02/2020: 35%mLAD and 70% dig >> medical therapy   . Hyperlipidemia LDL goal <70   . Hypertension   . Stress-induced cardiomyopathy    Past Surgical History:  Procedure Laterality Date  . LEFT HEART CATH AND CORONARY ANGIOGRAPHY N/A 02/24/2020   Procedure: LEFT HEART CATH AND CORONARY ANGIOGRAPHY;  Surgeon: Yvonne Kendall, MD;  Location: [facility];  Service: Cardiovascular;  Laterality: N/A;    Allergies  Allergies  Allergen Reactions  . Beta Adrenergic Blockers Shortness Of Breath    Unnamed beta blocker (name not recalled by the patient) = shortness of breath  . Percodan [Oxycodone-Aspirin] Nausea And Vomiting  . Lisinopril Other (See Comments)    Patient does not recall the reaction  . Pork-Derived Products Other (See Comments)    Religious reasons = No pork  . Nickel Itching and Rash    History of Present Illness    She presented to the emergency department on 02/23/2020 with jaw pain and elevated troponins.  Over the prior few days she has noticed increased DOE.  She typically would walk 30 minutes daily but had noticed increased DOE.  She contacted EMS and was transported to [facility] ED for further evaluation.  Her systolic blood pressures were in the 170s.  Her initial troponin was 372.  EKG showed a RBBB but now ST or T wave deviation.  She was given ASA and started on IV heparin.  She underwent cardiac catheterization on 02/24/2020.  It showed noncritical CAD, 70% ostial D2 stenosis, 20-40% mid LAD stenosis on  OM 2.  LVEF 35-40% consistent with stress-induced cardiomyopathy.  She also had significant vasospasm via right radial approach.  Alternate approach was recommended if further cardiac catheterizations are needed.  Echocardiogram 02/24/2020 showed an EF of 40-45%, G1 DD, findings consistent with Takotsubo cardiomyopathy and no significant valvular abnormalities.  She presented the clinic 03/12/2020 for further evaluation and stated her blood pressures had been low  that mornings.  90s over 60s.  She stated she felt better as the day went on and her blood pressure normalized.  Her mother-in-law was living at her house and was increasing her stress.  Just before she presented to the emergency department her mother-in-law fell and had a spine fracture.  She was  observing Ramadan at the time of the visit and eating twice per day.  Her mother-in-law was  in a rehab facility at the time of the visit.  I encouraged her to continue to increase her physical activity slowly, eat a heart healthy diet, plan to add ARB in 3 weeks if blood pressure will allow.  Follow-up planned for 3 weeks.  She presents to the clinic today for follow-up evaluation and states she has increased her physical activity to 30 minutes of walking daily.  She continues to follow a low-sodium diet.  Her mother-in-law has moved back into their house but is receiving home health 3 days a week for 3 hours at a time.  This is helped somewhat relieve stress.  I will give  her 40 mg of valsartan daily, have her continue her physical activity, give her the salty 6 information sheet, and have her follow-up in 2 weeks, and repeat a BMP at that time.  Today she denies chest pain, increased shortness of breath, lower extremity edema, fatigue, palpitations, melena, hematuria, hemoptysis, diaphoresis, weakness, presyncope, syncope, orthopnea, and PND.  Home Medications    Prior to Admission medications   Medication Sig Start Date End Date Taking?  Authorizing Provider  aspirin EC 81 MG tablet Take 81 mg by mouth as needed (for chest pain/sudden onset of "heart-related" symptoms).    [provider]  atorvastatin (LIPITOR) 40 MG tablet Take 1 tablet (40 mg total) by mouth daily at 6 PM. 02/25/20   Bhagat, Luther, PA  Cholecalciferol (VITAMIN D3) 50 MCG (2000 UT) TABS Take 2,000 Units by mouth daily with breakfast.    [provider]  dimenhyDRINATE (DRAMAMINE) 50 MG tablet Take 25 mg by mouth at bedtime.    [provider]  ibuprofen (ADVIL) 200 MG tablet Take 200 mg by mouth every 6 (six) hours as needed for headache or mild pain.    [provider]  metoprolol succinate (TOPROL-XL) 25 MG 24 hr tablet Take 1 tablet (25 mg total) by mouth daily. 02/25/20   Bhagat, Crista Luria, PA  Multiple Vitamins-Minerals (ONE-A-DAY WOMENS 50 PLUS) TABS Take 1 tablet by mouth daily with breakfast.    [provider]  nitroGLYCERIN (NITROSTAT) 0.4 MG SL tablet Place 1 tablet (0.4 mg total) under the tongue every 5 (five) minutes x 3 doses as needed for chest pain. 02/25/20   Bhagat, Crista Luria, PA  Probiotic CAPS Take 1 capsule by mouth daily after breakfast.    [provider]  TURMERIC PO Take 1 capsule by mouth daily with breakfast.    [provider]    Family History    Family History  Problem Relation Age of Onset  . Heart disease Father    She indicated that her father is alive.  Social History    Social History   Socioeconomic History  . Marital status: Married    Spouse name: Not on file  . Number of children: Not on file  . Years of education: Not on file  . Highest education level: Not on file  Occupational History  . Not on file  Tobacco Use  . Smoking status: Never Smoker  . Smokeless tobacco: Never Used  Substance and Sexual Activity  . Alcohol use: Not on file  . Drug use: Not on file  . Sexual activity: Not on file  Other Topics Concern  . Not on file    Social History Narrative  . Not on file   Social Determinants of Health   Financial Resource Strain:   . Difficulty of Paying Living Expenses:   Food Insecurity:   . Worried About Charity fundraiser in the Last Year:   . Arboriculturist in the Last Year:   Transportation Needs:   . Film/video editor (Medical):   Marland Kitchen Lack of Transportation (Non-Medical):   Physical Activity:   . Days of Exercise per Week:   . Minutes of Exercise per Session:   Stress:   . Feeling of Stress :   Social Connections:   . Frequency of Communication with Friends and Family:   . Frequency of Social Gatherings with Friends and Family:   . Attends Religious Services:   . Active Member of Clubs or Organizations:   .  Attends Banker Meetings:   Marland Kitchen Marital Status:   Intimate Partner Violence:   . Fear of Current or Ex-Partner:   . Emotionally Abused:   Marland Kitchen Physically Abused:   . Sexually Abused:      Review of Systems    General:  No chills, fever, night sweats or weight changes.  Cardiovascular:  No chest pain, dyspnea on exertion, edema, orthopnea, palpitations, paroxysmal nocturnal dyspnea. Dermatological: No rash, lesions/masses Respiratory: No cough, dyspnea Urologic: No hematuria, dysuria Abdominal:   No nausea, vomiting, diarrhea, bright red blood per rectum, melena, or hematemesis Neurologic:  No visual changes, wkns, changes in mental status. All other systems reviewed and are otherwise negative except as noted above.  Physical Exam    VS:  BP 130/60   Pulse (!) 59   Temp (!) 97.3 F (36.3 C)   Ht 4\' 11"  (1.499 m)   Wt 142 lb 6.4 oz (64.6 kg)   SpO2 98%   BMI 28.76 kg/m  , BMI Body mass index is 28.76 kg/m. GEN: Well nourished, well developed, in no acute distress. HEENT: normal. Neck: Supple, no JVD, carotid bruits, or masses. Cardiac: RRR, no murmurs, rubs, or gallops. No clubbing, cyanosis, edema.  Radials/DP/PT 2+ and equal bilaterally.  Respiratory:   Respirations regular and unlabored, clear to auscultation bilaterally. GI: Soft, nontender, nondistended, BS + x 4. MS: no deformity or atrophy. Skin: warm and dry, no rash. Neuro:  Strength and sensation are intact. Psych: Normal affect.  Accessory Clinical Findings    ECG personally reviewed by me today-normal sinus rhythm incomplete right bundle branch block 62 bpm no acute changes.  Echocardiogram 02/24/2020 IMPRESSIONS    1. Left ventricular ejection fraction, by estimation, is 40 to 45%. Left  ventricular ejection fraction by 2D MOD biplane is 42.5 %. The left  ventricle has mildly decreased function. The left ventricle demonstrates  regional wall motion abnormalities (see  scoring diagram/findings for description). There is mild asymmetric left  ventricular hypertrophy of the septal segment. Left ventricular diastolic  parameters are consistent with Grade I diastolic dysfunction (impaired  relaxation). Hypokinesis of the mid  to apical segments with relative sparing of the basal segments.  Concerning for Takotsubo cardiomyopathy . The average left ventricular  global longitudinal strain is -12.5 %.  2. Right ventricular systolic function is normal. The right ventricular  size is normal.  3. The mitral valve is normal in structure. No evidence of mitral valve  regurgitation. No evidence of mitral stenosis.  4. The aortic valve is tricuspid. Aortic valve regurgitation is not  visualized. No aortic stenosis is present.  5. The inferior vena cava is normal in size with greater than 50%  respiratory variability, suggesting right atrial pressure of 3 mmHg.    Cardiac catheterization 02/24/2020 1. Non-critical coronary artery disease, including 70% ostial D2 stenosis and 20-40% lesions involving the mid LAD on OM2. 2. Moderately reduced left ventricular systolic function with mid and apical anterior, apical, and apical inferior akinesis. LVEF 35-45%. Wall motion  abnormality is most consistent with stress-induced cardiomyopathy. 3. Normal left ventricular filling pressure. 4. Significant right radial artery vasospasm affecting catheter selection/manipulation. Consider alternate approach if catheterization is needed in the future.  Recommendation: 1. Medical therapy to prevent progression of coronary artery disease, including statin and aspirin therapy. If patient has refractory symptoms with objective ischemia in D2 territory, PCI could be considered though intervention could jeopardize the LAD. 2. Optimize evidence-based heart failure therapy; metoprolol succinate should be  continued, with addition of ARB tomorrow if renal function and blood pressure tolerate.  Diagnostic Dominance: Right  Intervention  Assessment & Plan   1.   Stress-induced cardiomyopathy-continues to have no chest pain today.EKG showed a RBBB but no ST or T wave deviation.  She was given ASA and started on IV heparin.  She underwent cardiac catheterization on 02/24/2020.  It showed noncritical CAD, 70% ostial D2 stenosis, 20-40% mid LAD stenosis on OM 2.  LVEF 35-40% consistent with stress-induced cardiomyopathy.  She also had significant vasospasm via right radial approach.  Alternate approach was recommended if further cardiac catheterizations are needed.  Echocardiogram 02/24/2020 showed an EF of 40-45%, G1 DD, findings consistent with Takotsubo cardiomyopathy and no significant valvular abnormalities. Continue current medical therapy  Start valsartan 40 mg  daily if blood pressure will allow in 3 weeks. Heart healthy low-sodium diet Increase physical activity as tolerated   Essential hypertension-BP today 130/60.  Similar control at home. Continue current medical therapy Heart healthy low-sodium diet-salty 6 given Increase physical activity as tolerated  Hyperlipidemia-LDL 99 on 02/24/2020 Continue atorvastatin 40 mg Follow-up lipid panel and LFTs 6  weeks.  Disposition: Follow-up with  Dr. Allyson Sabal or me in  2 weeks  Kayla [name]. Kayla Hesser NP-C    04/03/2020, 3:00 PM [facility] [address] Office [phone] Fax [phone]

## 2020-04-03 ENCOUNTER — Encounter: Payer: Self-pay | Admitting: General Practice

## 2020-04-03 ENCOUNTER — Ambulatory Visit: Payer: BC Managed Care – PPO | Admitting: General Practice

## 2020-04-03 ENCOUNTER — Other Ambulatory Visit: Payer: Self-pay

## 2020-04-03 VITALS — BP 130/60 | HR 59 | Temp 97.3°F | Ht 59.0 in | Wt 142.4 lb

## 2020-04-03 DIAGNOSIS — I5181 Takotsubo syndrome: Secondary | ICD-10-CM

## 2020-04-03 DIAGNOSIS — E7800 Pure hypercholesterolemia, unspecified: Secondary | ICD-10-CM

## 2020-04-03 DIAGNOSIS — I10 Essential (primary) hypertension: Secondary | ICD-10-CM | POA: Diagnosis not present

## 2020-04-03 DIAGNOSIS — Z79899 Other long term (current) drug therapy: Secondary | ICD-10-CM

## 2020-04-03 MED ORDER — VALSARTAN 40 MG PO TABS: 40.0000 mg | ORAL_TABLET | Freq: Every day | ORAL | 6 refills | Status: DC

## 2020-04-03 NOTE — Patient Instructions (Signed)
Medication Instructions:  START VALSARTAN 40MG  DAILY *If you need a refill on your cardiac medications before your next appointment, please call your pharmacy*  Lab Work: BMET A FEW DAYS BEFORE APPT FOR REVIEW AT APPOINTMENT If you have labs (blood work) drawn today and your tests are completely normal, you will receive your results only by:  MyChart Message (if you have MyChart) OR A paper copy in the mail.  If you have any lab test that is abnormal or we need to change your treatment, we will call you to review the results. You may go to any [facility] that is convenient for you however, we do have a lab in our office that is able to assist you. You DO NOT need an appointment for our lab. The lab is open 8:00am and closes at 4:00pm. Lunch 12:45 - 1:45pm.  Special Instructions PLEASE INCREASE PHYSICAL ACTIVITY AS TOLERATED  PLEASE READ AND FOLLOW SALTY 6-ATTACHED  Follow-Up: Your next appointment:  2 week(s) Please call our office 2 months in advance to schedule this appointment In Person with You may see , MD Nanetta Batty, FNP or one of the following Advanced Practice Providers on your designated Care Team:  Edd Fabian, PA-C  Corine Shelter, Marjie Skiff  At [facility], you and your health needs are our priority.  As part of our continuing mission to provide you with exceptional heart care, we have created designated Provider Care Teams.  These Care Teams include your primary Cardiologist (physician) and Advanced Practice Providers (APPs -  Physician Assistants and Nurse Practitioners) who all work together to provide you with the care you need, when you need it.

## 2020-04-17 ENCOUNTER — Ambulatory Visit: Payer: BC Managed Care – PPO | Admitting: Cardiovascular Disease

## 2020-04-17 ENCOUNTER — Other Ambulatory Visit: Payer: Self-pay

## 2020-04-17 ENCOUNTER — Encounter: Payer: Self-pay | Admitting: Cardiovascular Disease

## 2020-04-17 VITALS — BP 115/81 | HR 66 | Ht 59.5 in | Wt 141.0 lb

## 2020-04-17 DIAGNOSIS — E7800 Pure hypercholesterolemia, unspecified: Secondary | ICD-10-CM

## 2020-04-17 DIAGNOSIS — I214 Non-ST elevation (NSTEMI) myocardial infarction: Secondary | ICD-10-CM

## 2020-04-17 DIAGNOSIS — E782 Mixed hyperlipidemia: Secondary | ICD-10-CM

## 2020-04-17 DIAGNOSIS — I5181 Takotsubo syndrome: Secondary | ICD-10-CM | POA: Insufficient documentation

## 2020-04-17 DIAGNOSIS — I428 Other cardiomyopathies: Secondary | ICD-10-CM

## 2020-04-17 DIAGNOSIS — E785 Hyperlipidemia, unspecified: Secondary | ICD-10-CM | POA: Insufficient documentation

## 2020-04-17 DIAGNOSIS — I10 Essential (primary) hypertension: Secondary | ICD-10-CM

## 2020-04-17 NOTE — Progress Notes (Signed)
04/17/2020 Steffanie Rainwater   Nov 21, 1955  [id number]  Primary Physician Maurice Small, MD Primary Cardiologist: Runell Gess MD Nicholes Calamity, [place]  HPI:  Kayla [name] is a 64 y.o. mildly overweight married Caucasian female mother of 2 children who I am seeing for the first time for post hospital follow-up.  She has seen Edd Fabian, FNP recently as well.  She works as a Designer, multimedia for [employer] and has a degree in toxicology.  She does have a history of hypertension hyperlipidemia as well as family history of heart disease with a father who had CABG in his 107s.  She developed chest pain 2 days after receiving her Covid vaccine and was seen in the hospital by me on 02/24/2020.  She underwent cardiac catheterization the following day via the right radial approach by Dr. Okey Dupre revealing noncritical CAD with moderate LV dysfunction and wall motion abnormalities consistent with "Takotsubo syndrome".  She was discharged home on valsartan and metoprolol.  She still complains of some fatigue and shortness of breath.   Current Meds  Medication Sig  . aspirin EC 81 MG tablet Take 81 mg by mouth as needed (for chest pain/sudden onset of "heart-related" symptoms).  Marland Kitchen atorvastatin (LIPITOR) 40 MG tablet Take 1 tablet (40 mg total) by mouth daily at 6 PM.  . Cholecalciferol (VITAMIN D3) 50 MCG (2000 UT) TABS Take 2,000 Units by mouth daily with breakfast.  . dimenhyDRINATE (DRAMAMINE) 50 MG tablet Take 25 mg by mouth at bedtime.  Marland Kitchen ibuprofen (ADVIL) 200 MG tablet Take 200 mg by mouth every 6 (six) hours as needed for headache or mild pain.  . metoprolol succinate (TOPROL-XL) 25 MG 24 hr tablet Take 1 tablet (25 mg total) by mouth daily.  . Multiple Vitamins-Minerals (ONE-A-DAY WOMENS 50 PLUS) TABS Take 1 tablet by mouth daily with breakfast.  . nitroGLYCERIN (NITROSTAT) 0.4 MG SL tablet Place 1 tablet (0.4 mg total) under the tongue every 5 (five) minutes x 3 doses as needed for chest  pain.  . Probiotic CAPS Take 1 capsule by mouth daily after breakfast.  . TURMERIC PO Take 1 capsule by mouth daily with breakfast.  . valsartan (DIOVAN) 40 MG tablet Take 1 tablet (40 mg total) by mouth daily.     Allergies  Allergen Reactions  . Beta Adrenergic Blockers Shortness Of Breath    Unnamed beta blocker (name not recalled by the patient) = shortness of breath  . Percodan [Oxycodone-Aspirin] Nausea And Vomiting  . Lisinopril Other (See Comments)    Patient does not recall the reaction  . Pork-Derived Products Other (See Comments)    Religious reasons = No pork  . Nickel Itching and Rash    Social History   Socioeconomic History  . Marital status: Married    Spouse name: Not on file  . Number of children: Not on file  . Years of education: Not on file  . Highest education level: Not on file  Occupational History  . Not on file  Tobacco Use  . Smoking status: Never Smoker  . Smokeless tobacco: Never Used  Substance and Sexual Activity  . Alcohol use: Not on file  . Drug use: Not on file  . Sexual activity: Not on file  Other Topics Concern  . Not on file  Social History Narrative  . Not on file   Social Determinants of Health   Financial Resource Strain:   . Difficulty of Paying Living Expenses:  Food Insecurity:   . Worried About Charity fundraiser in the Last Year:   . Arboriculturist in the Last Year:   Transportation Needs:   . Film/video editor (Medical):   Marland Kitchen Lack of Transportation (Non-Medical):   Physical Activity:   . Days of Exercise per Week:   . Minutes of Exercise per Session:   Stress:   . Feeling of Stress :   Social Connections:   . Frequency of Communication with Friends and Family:   . Frequency of Social Gatherings with Friends and Family:   . Attends Religious Services:   . Active Member of Clubs or Organizations:   . Attends Archivist Meetings:   Marland Kitchen Marital Status:   Intimate Partner Violence:   . Fear of  Current or Ex-Partner:   . Emotionally Abused:   Marland Kitchen Physically Abused:   . Sexually Abused:      Review of Systems: General: negative for chills, fever, night sweats or weight changes.  Cardiovascular: negative for chest pain, dyspnea on exertion, edema, orthopnea, palpitations, paroxysmal nocturnal dyspnea or shortness of breath Dermatological: negative for rash Respiratory: negative for cough or wheezing Urologic: negative for hematuria Abdominal: negative for nausea, vomiting, diarrhea, bright red blood per rectum, melena, or hematemesis Neurologic: negative for visual changes, syncope, or dizziness All other systems reviewed and are otherwise negative except as noted above.    Blood pressure 115/81, pulse 66, height 4' 11.5" (1.511 m), weight 141 lb (64 kg), SpO2 96 %.  General appearance: alert and no distress Neck: no adenopathy, no carotid bruit, no JVD, supple, symmetrical, trachea midline and thyroid not enlarged, symmetric, no tenderness/mass/nodules Lungs: clear to auscultation bilaterally Heart: regular rate and rhythm, S1, S2 normal, no murmur, click, rub or gallop Extremities: extremities normal, atraumatic, no cyanosis or edema Pulses: 2+ and symmetric Skin: Skin color, texture, turgor normal. No rashes or lesions Neurologic: Alert and oriented X 3, normal strength and tone. Normal symmetric reflexes. Normal coordination and gait  EKG not performed today  ASSESSMENT AND PLAN:   Essential hypertension History of essential hypertension with blood pressure measured today 115/81.  She is on metoprolol and valsartan.  NSTEMI (non-ST elevated myocardial infarction) [facility]) Ms. Kapfer was seen by me 02/25/2020 for a non-STEMI.  This had occurred 2 days after receiving her Covid vaccine.  She did have a left heart cath via the right radial approach by Dr. Saunders Revel revealing noncritical CAD with an EF of 40 to 45% and wall motion consistent with "Takotsubo cardiomyopathy.  She was  placed on metoprolol and valsartan.  She still complains of some weakness and shortness of breath.  We will check a 2D echo next month to see if she has had improvement LV function.  Nonischemic cardiomyopathy (HCC) Ejection fraction in the 40 to 45% range with Takotsubo syndrome on appropriate medications.  I am going to recheck a 2D echo next month.  Hyperlipidemia History of hyperlipidemia on statin therapy with lipid profile performed 02/24/2020 revealing total cholesterol 168, LDL of 99 and HDL 52.  We will recheck a lipid liver profile.      Lorretta Harp MD FACP,FACC,FAHA, [facility] 04/17/2020 3:05 PM

## 2020-04-17 NOTE — Assessment & Plan Note (Signed)
History of essential hypertension with blood pressure measured today 115/81.  She is on metoprolol and valsartan.

## 2020-04-17 NOTE — Assessment & Plan Note (Signed)
Kayla [name] was seen by me 02/25/2020 for a non-STEMI.  This had occurred 2 days after receiving her Covid vaccine.  She did have a left heart cath via the right radial approach by Dr. Okey Dupre revealing noncritical CAD with an EF of 40 to 45% and wall motion consistent with "Takotsubo cardiomyopathy.  She was placed on metoprolol and valsartan.  She still complains of some weakness and shortness of breath.  We will check a 2D echo next month to see if she has had improvement LV function.

## 2020-04-17 NOTE — Assessment & Plan Note (Signed)
History of hyperlipidemia on statin therapy with lipid profile performed 02/24/2020 revealing total cholesterol 168, LDL of 99 and HDL 52.  We will recheck a lipid liver profile.

## 2020-04-17 NOTE — Assessment & Plan Note (Signed)
Ejection fraction in the 40 to 45% range with Takotsubo syndrome on appropriate medications.  I am going to recheck a 2D echo next month.

## 2020-04-17 NOTE — Patient Instructions (Signed)
Medication Instructions:  Your physician recommends that you continue on your current medications as directed. Please refer to the Current Medication list given to you today.  *If you need a refill on your cardiac medications before your next appointment, please call your pharmacy*   Lab Work: FASTING lipid panel & liver function test If you have labs (blood work) drawn today and your tests are completely normal, you will receive your results only by: Marland Kitchen MyChart Message (if you have MyChart) OR . A paper copy in the mail If you have any lab test that is abnormal or we need to change your treatment, we will call you to review the results.   Testing/Procedures: Your physician has requested that you have an echocardiogram - mid July. Echocardiography is a painless test that uses sound waves to create images of your heart. It provides your doctor with information about the size and shape of your heart and how well your heart's chambers and valves are working. This procedure takes approximately one hour. There are no restrictions for this procedure. -- [address]    Follow-Up: At [facility], you and your health needs are our priority.  As part of our continuing mission to provide you with exceptional heart care, we have created designated Provider Care Teams.  These Care Teams include your primary Cardiologist (physician) and Advanced Practice Providers (APPs -  Physician Assistants and Nurse Practitioners) who all work together to provide you with the care you need, when you need it.  We recommend signing up for the patient portal called "MyChart".  Sign up information is provided on this After Visit Summary.  MyChart is used to connect with patients for Virtual Visits (Telemedicine).  Patients are able to view lab/test results, encounter notes, upcoming appointments, etc.  Non-urgent messages can be sent to your provider as well.   To learn more about what you can do with  MyChart, go to [email or web address].    Your next appointment:   3 months with Verdon Cummins NP  12 months with Dr. Allyson Sabal

## 2020-04-22 DIAGNOSIS — E7800 Pure hypercholesterolemia, unspecified: Secondary | ICD-10-CM | POA: Diagnosis not present

## 2020-04-22 DIAGNOSIS — I10 Essential (primary) hypertension: Secondary | ICD-10-CM | POA: Diagnosis not present

## 2020-04-22 DIAGNOSIS — I214 Non-ST elevation (NSTEMI) myocardial infarction: Secondary | ICD-10-CM | POA: Diagnosis not present

## 2020-04-22 LAB — HEPATIC FUNCTION PANEL
ALT: 12 IU/L (ref 0–32)
AST: 18 IU/L (ref 0–40)
Albumin: 4.1 g/dL (ref 3.8–4.8)
Alkaline Phosphatase: 111 IU/L (ref 48–121)
Bilirubin Total: <0.2 mg/dL (ref 0.0–1.2)
Bilirubin, Direct: 0.1 mg/dL (ref 0.00–0.40)
Total Protein: 6.9 g/dL (ref 6.0–8.5)

## 2020-04-22 LAB — LIPID PANEL
Chol/HDL Ratio: 2.7 ratio (ref 0.0–4.4)
Cholesterol, Total: 118 mg/dL (ref 100–199)
HDL: 43 mg/dL (ref >39)
LDL Chol Calc (NIH): 58 mg/dL (ref 0–99)
Triglycerides: 89 mg/dL (ref 0–149)
VLDL Cholesterol Cal: 17 mg/dL (ref 5–40)

## 2020-05-25 ENCOUNTER — Telehealth: Payer: Self-pay | Admitting: Cardiovascular Disease

## 2020-05-25 NOTE — Telephone Encounter (Signed)
Pt c/o of Chest Pain: STAT if CP now or developed within 24 hours  1. Are you having CP right now? no  2. Are you experiencing any other symptoms (ex. SOB, nausea, vomiting, sweating)? Nausea, SOB both last night not now  3. How long have you been experiencing CP? The past 2 nights  4. Is your CP continuous or coming and going? Comes and goes  5. Have you taken Nitroglycerin? Yes  Patient states the past 2 nights she has had to take 1 nitroglycerin. She states she gets a draining or tightening sensation in her jaw, neck, back, and chest. She states both nights she also had some nausea and SOB, but is not having any symptoms now. She states it happens around 7:30 pm after she finished dinner and before she took her medications. She would like to know if she should take her medications earlier.

## 2020-05-25 NOTE — Telephone Encounter (Signed)
Patient stated the last two night she has had chest pain, SOB, and nausea after dinner, which was relieved with one dose of nitro. Patient had heart cath in 02/2020.  Patient stated she feels fine right now. Made patient a virtual appointment with Corine Shelter PA tomorrow to be evaluated. This was the first appointment available that patient could make. Encouraged patient to go to ED if her chest pain and SOB comes back and to call 911 if her chest pain is not relieved with nitro. Will send message to Dr. Allyson Sabal for further advisement.

## 2020-05-26 ENCOUNTER — Telehealth: Payer: Self-pay | Admitting: Cardiology

## 2020-05-26 ENCOUNTER — Encounter: Payer: Self-pay | Admitting: Cardiology

## 2020-05-26 ENCOUNTER — Telehealth (INDEPENDENT_AMBULATORY_CARE_PROVIDER_SITE_OTHER): Payer: BC Managed Care – PPO | Admitting: Cardiology

## 2020-05-26 VITALS — BP 127/88 | Ht 59.5 in | Wt 136.0 lb

## 2020-05-26 DIAGNOSIS — R079 Chest pain, unspecified: Secondary | ICD-10-CM | POA: Insufficient documentation

## 2020-05-26 DIAGNOSIS — I5181 Takotsubo syndrome: Secondary | ICD-10-CM | POA: Diagnosis not present

## 2020-05-26 DIAGNOSIS — I10 Essential (primary) hypertension: Secondary | ICD-10-CM

## 2020-05-26 DIAGNOSIS — I252 Old myocardial infarction: Secondary | ICD-10-CM | POA: Diagnosis not present

## 2020-05-26 MED ORDER — ISOSORBIDE MONONITRATE ER 30 MG PO TB24
30.0000 mg | ORAL_TABLET | Freq: Every day | ORAL | 1 refills | Status: DC
Start: 2020-05-26 — End: 2020-06-17

## 2020-05-26 NOTE — Telephone Encounter (Signed)
Okay thanks! Just wanted to check, I didn't see if on the list earlier when she called so wanted to make sure- it is added now.   Thank you!

## 2020-05-26 NOTE — Telephone Encounter (Signed)
Pt c/o medication issue:  1. Name of Medication: long acting nitroglycerin  2. How are you currently taking this medication (dosage and times per day)? Has not started  3. Are you having a reaction (difficulty breathing--STAT)? no  4. What is your medication issue? Patient states she is supposed to get a new prescription for a long acting nitroglycerin. She states it needs to be sent to [facility] on [address].

## 2020-05-26 NOTE — Telephone Encounter (Signed)
Please advise if you needed the RX for possibly imdur?  She states long acting nitro.  Thanks!

## 2020-05-26 NOTE — Telephone Encounter (Signed)
Yes Imdur 30mg  QD- dispense # 30 with 5 refills.  I was working with - Im pretty sure she took care of that.  Almira Bar PA-C 05/26/2020 4:04 PM

## 2020-05-26 NOTE — Progress Notes (Signed)
Virtual Visit via Video Note   This visit type was conducted due to national recommendations for restrictions regarding the COVID-19 Pandemic (e.g. social distancing) in an effort to limit this patient's exposure and mitigate transmission in our community.  Due to her co-morbid illnesses, this patient is at least at moderate risk for complications without adequate follow up.  This format is felt to be most appropriate for this patient at this time.  All issues noted in this document were discussed and addressed.  A limited physical exam was performed with this format.  Please refer to the patient's chart for her consent to telehealth for [facility].   The patient was identified using 2 identifiers.  Date:  05/26/2020   ID:  Kayla [name], DOB [birth year], MRN [id number]  Patient Location: Home Provider Location: Home Office  PCP:  Maurice Small, MD  Cardiologist:  Nanetta Batty, MD  Electrophysiologist:  None   Evaluation Performed:  Follow-Up Visit  Chief Complaint:  Chest pain  History of Present Illness:    Kayla [name] is a 64 y.o. female with a history of a NSTEMI in April 2021 which apparently occurred a couple days after she received her Covid vaccine.  Heart catheterization showed nonobstructive coronary disease.  Echocardiogram showed ejection fraction of 40 to 45% with evidence of apical hypokinesis consistent with a Takotsubo event.  The patient has been on medical therapy.  She is due for a follow-up echocardiogram Friday of this week.  She contacted the office after she had an episode of chest jaw and back pain on Saturday and again on Sunday.  Both events happened in the evening while she was watching TV.  She describes jaw pain tachycardia and shortness of breath with some nausea.  The symptoms were similar to her pre-NSTEMI symptoms but not as severe.  She had no associated diaphoresis with these recent episodes.  She did take a nitroglycerin with quick relief both  times.  Last night she felt well and has not had another event.  The patient does not have symptoms concerning for COVID-19 infection (fever, chills, cough, or new shortness of breath).    Past Medical History:  Diagnosis Date  . CAD (coronary artery disease), native coronary artery    a. cath 02/2020: 35%mLAD and 70% dig >> medical therapy   . Hyperlipidemia LDL goal <70   . Hypertension   . Stress-induced cardiomyopathy    Past Surgical History:  Procedure Laterality Date  . LEFT HEART CATH AND CORONARY ANGIOGRAPHY N/A 02/24/2020   Procedure: LEFT HEART CATH AND CORONARY ANGIOGRAPHY;  Surgeon: Yvonne Kendall, MD;  Location: [facility] INV[facility];  Service: Cardiovascular;  Laterality: N/A;     Current Meds  Medication Sig  . aspirin EC 81 MG tablet Take 81 mg by mouth as needed (for chest pain/sudden onset of "heart-related" symptoms).  Marland Kitchen atorvastatin (LIPITOR) 40 MG tablet Take 1 tablet (40 mg total) by mouth daily at 6 PM.  . Cholecalciferol (VITAMIN D3) 50 MCG (2000 UT) TABS Take 2,000 Units by mouth daily with breakfast.  . dimenhyDRINATE (DRAMAMINE) 50 MG tablet Take 25 mg by mouth at bedtime.  Marland Kitchen ibuprofen (ADVIL) 200 MG tablet Take 200 mg by mouth every 6 (six) hours as needed for headache or mild pain.  . metoprolol succinate (TOPROL-XL) 25 MG 24 hr tablet Take 1 tablet (25 mg total) by mouth daily.  . Multiple Vitamins-Minerals (ONE-A-DAY WOMENS 50 PLUS) TABS Take 1 tablet by mouth daily with  breakfast.  . nitroGLYCERIN (NITROSTAT) 0.4 MG SL tablet Place 1 tablet (0.4 mg total) under the tongue every 5 (five) minutes x 3 doses as needed for chest pain.  . Probiotic CAPS Take 1 capsule by mouth daily after breakfast.  . TURMERIC PO Take 1 capsule by mouth daily with breakfast.  . valsartan (DIOVAN) 40 MG tablet Take 1 tablet (40 mg total) by mouth daily.     Allergies:   Beta adrenergic blockers, Percodan [oxycodone-aspirin], Lisinopril, Pork-derived products, and Nickel    Social History   Tobacco Use  . Smoking status: Never Smoker  . Smokeless tobacco: Never Used  Substance Use Topics  . Alcohol use: Not on file  . Drug use: Not on file     Family Hx: The patient's family history includes Heart disease in her father.  ROS:   Please see the history of present illness.    All other systems reviewed and are negative.   Prior CV studies:   The following studies were reviewed today:  Echo 02/24/2020- IMPRESSIONS    1. Left ventricular ejection fraction, by estimation, is 40 to 45%. Left  ventricular ejection fraction by 2D MOD biplane is 42.5 %. The left  ventricle has mildly decreased function. The left ventricle demonstrates  regional wall motion abnormalities (see  scoring diagram/findings for description). There is mild asymmetric left  ventricular hypertrophy of the septal segment. Left ventricular diastolic  parameters are consistent with Grade I diastolic dysfunction (impaired  relaxation). Hypokinesis of the mid  to apical segments with relative sparing of the basal segments.  Concerning for Takotsubo cardiomyopathy . The average left ventricular  global longitudinal strain is -12.5 %.  2. Right ventricular systolic function is normal. The right ventricular  size is normal.  3. The mitral valve is normal in structure. No evidence of mitral valve  regurgitation. No evidence of mitral stenosis.  4. The aortic valve is tricuspid. Aortic valve regurgitation is not  visualized. No aortic stenosis is present.  5. The inferior vena cava is normal in size with greater than 50%  respiratory variability, suggesting right atrial pressure of 3 mmHg.   Labs/Other Tests and Data Reviewed:    EKG:  An ECG dated 04/03/2020 was personally reviewed today and demonstrated:  NSR, incomplete RBBB, anterior TWI  Recent Labs: 02/25/2020: BUN 10; Creatinine, Ser 0.76; Hemoglobin 13.8; Platelets 315; Potassium 4.2; Sodium 138 04/22/2020: ALT 12    Recent Lipid Panel Lab Results  Component Value Date/Time   CHOL 118 04/22/2020 08:55 AM   TRIG 89 04/22/2020 08:55 AM   HDL 43 04/22/2020 08:55 AM   CHOLHDL 2.7 04/22/2020 08:55 AM   CHOLHDL 3.2 02/24/2020 01:06 AM   LDLCALC 58 04/22/2020 08:55 AM    Wt Readings from Last 3 Encounters:  05/26/20 136 lb (61.7 kg)  04/17/20 141 lb (64 kg)  04/03/20 142 lb 6.4 oz (64.6 kg)     Objective:    Vital Signs:  BP 127/88   Ht 4' 11.5" (1.511 m)   Wt 136 lb (61.7 kg)   LMP  (LMP Unknown)   BMI 27.01 kg/m    VITAL SIGNS:  reviewed  ASSESSMENT & PLAN:    Chest, jaw, back, and shoulder pain- Symptoms are concerning - ? coconary spasm vs SVT.  I suggested we start Imdur 30 mg daily and I asked her to take her Toprol in the morning as opposed to the evening.  She'll have her echo Friday and will need an  OV next week.  She knows to go to the ED if her symptoms are not promptly relieved with a NTG.   H/O Takotsubo NSTEMI- April 2021- a few days after COVID vaccine. EF 40-45% by echo then.  HTN- On Valsartan and now Toprol.  Plan: Imdur 30 mg. OV next week (after her echo Friday).    COVID-19 Education: The signs and symptoms of COVID-19 were discussed with the patient and how to seek care for testing (follow up with PCP or arrange E-visit).  The importance of social distancing was discussed today.  Time:   Today, I have spent 22 minutes with the patient with telehealth technology discussing the above problems.     Medication Adjustments/Labs and Tests Ordered: Current medicines are reviewed at length with the patient today.  Concerns regarding medicines are outlined above.   Tests Ordered: No orders of the defined types were placed in this encounter.   Medication Changes: No orders of the defined types were placed in this encounter.   Follow Up:  In Person OV next week  Signed, Corine Shelter, Cordelia Poche  05/26/2020 2:01 PM    [facility]

## 2020-05-26 NOTE — Patient Instructions (Addendum)
Medication Instructions:  Your physician has recommended you make the following change in your medication:   1) Start Imdur 30 mg, 1 tablet by mouth once a day  *If you need a refill on your cardiac medications before your next appointment, please call your pharmacy*  Lab Work: None ordered today  If you have labs (blood work) drawn today and your tests are completely normal, you will receive your results only by: Marland Kitchen MyChart Message (if you have MyChart) OR . A paper copy in the mail If you have any lab test that is abnormal or we need to change your treatment, we will call you to review the results.  Testing/Procedures: None ordered today  Follow-Up:  On 06/17/20 with Dr. Allyson Sabal

## 2020-05-29 ENCOUNTER — Ambulatory Visit (HOSPITAL_COMMUNITY): Payer: BC Managed Care – PPO | Attending: Internal Medicine

## 2020-05-29 ENCOUNTER — Other Ambulatory Visit: Payer: Self-pay

## 2020-05-29 ENCOUNTER — Other Ambulatory Visit: Payer: Self-pay | Admitting: Family Medicine

## 2020-05-29 DIAGNOSIS — I214 Non-ST elevation (NSTEMI) myocardial infarction: Secondary | ICD-10-CM

## 2020-05-29 DIAGNOSIS — Z0000 Encounter for general adult medical examination without abnormal findings: Secondary | ICD-10-CM | POA: Diagnosis not present

## 2020-05-29 DIAGNOSIS — I10 Essential (primary) hypertension: Secondary | ICD-10-CM | POA: Diagnosis not present

## 2020-05-29 DIAGNOSIS — I428 Other cardiomyopathies: Secondary | ICD-10-CM | POA: Insufficient documentation

## 2020-05-29 DIAGNOSIS — M8580 Other specified disorders of bone density and structure, unspecified site: Secondary | ICD-10-CM

## 2020-05-29 LAB — ECHOCARDIOGRAM COMPLETE
Area-P 1/2: 3.23 cm2
P 1/2 time: 781 msec
S' Lateral: 2.5 cm

## 2020-05-31 NOTE — Telephone Encounter (Signed)
Pt had cath 3 months ago by Dr End that showed non critical CAD. ? Non cardiac CP

## 2020-06-01 NOTE — Telephone Encounter (Signed)
Patient had VV on 7/13 with L. Kilroy PA

## 2020-06-16 ENCOUNTER — Ambulatory Visit: Payer: BC Managed Care – PPO | Admitting: Cardiovascular Disease

## 2020-06-17 ENCOUNTER — Encounter: Payer: Self-pay | Admitting: Cardiovascular Disease

## 2020-06-17 ENCOUNTER — Ambulatory Visit: Payer: BC Managed Care – PPO | Admitting: Cardiovascular Disease

## 2020-06-17 ENCOUNTER — Other Ambulatory Visit: Payer: Self-pay

## 2020-06-17 ENCOUNTER — Encounter: Payer: Self-pay | Admitting: *Deleted

## 2020-06-17 DIAGNOSIS — I5181 Takotsubo syndrome: Secondary | ICD-10-CM

## 2020-06-17 DIAGNOSIS — R002 Palpitations: Secondary | ICD-10-CM

## 2020-06-17 DIAGNOSIS — E782 Mixed hyperlipidemia: Secondary | ICD-10-CM

## 2020-06-17 DIAGNOSIS — I252 Old myocardial infarction: Secondary | ICD-10-CM

## 2020-06-17 DIAGNOSIS — I10 Essential (primary) hypertension: Secondary | ICD-10-CM | POA: Diagnosis not present

## 2020-06-17 NOTE — Progress Notes (Signed)
Patient ID: Kayla [name], female   DOB: October 04, 1956, 64 y.o.   MRN: [id number] Patient enrolled for [employer] to ship a 14 day ZIO XT long term holter monitor to her home.

## 2020-06-17 NOTE — Assessment & Plan Note (Signed)
History of hyperlipidemia on statin therapy with lipid profile performed 03/22/2020 revealing total cholesterol 118, with an LDL of 58 and HDL 48.

## 2020-06-17 NOTE — Assessment & Plan Note (Signed)
History of Takotsubo cardiomyopathy with recent echo performed 05/29/2020 revealing normal sensation of her LV function on metoprolol and Diovan.

## 2020-06-17 NOTE — Assessment & Plan Note (Signed)
History of essential hypertension blood pressure measured today 126/80. She is on metoprolol and Diovan.

## 2020-06-17 NOTE — Progress Notes (Signed)
06/17/2020 Steffanie Rainwater   1956/10/07  [id number]  Primary Physician Maurice Small, MD Primary Cardiologist: Runell Gess MD Nicholes Calamity, [place]  HPI:  Kayla [name] is a 64 y.o.  mildly overweight married Caucasian female mother of 2 children who I am seeing for the first time for post hospital follow-up.  She has seen Edd Fabian, FNP recently as well as Corine Shelter, PA-C. 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  She does have a history of hypertension hyperlipidemia as well as family history of heart disease with a father who had CABG in his 19s.  She developed chest pain 2 days after receiving her Covid vaccine and was seen in the hospital by me on 02/24/2020.  She underwent cardiac catheterization the following day via the right radial approach by Dr. Okey Dupre revealing noncritical CAD with moderate LV dysfunction and wall motion abnormalities consistent with "Takotsubo syndrome".  She was discharged home on valsartan and metoprolol.  She still complains of some fatigue and shortness of breath.  Recent 2D echocardiogram performed 05/29/2020 revealed normalization of LV function with grade 1 diastolic dysfunction. She still complains of weakness and occasional atypical chest pain as well as palpitations in the evening when she lies down. She does not drink caffeinated beverages.    Current Meds  Medication Sig  . aspirin EC 81 MG tablet Take 81 mg by mouth as needed (for chest pain/sudden onset of "heart-related" symptoms).  Marland Kitchen atorvastatin (LIPITOR) 40 MG tablet Take 1 tablet (40 mg total) by mouth daily at 6 PM.  . Cholecalciferol (VITAMIN D3) 50 MCG (2000 UT) TABS Take 2,000 Units by mouth daily with breakfast.  . dimenhyDRINATE (DRAMAMINE) 50 MG tablet Take 25 mg by mouth at bedtime.  Marland Kitchen ibuprofen (ADVIL) 200 MG tablet Take 200 mg by mouth every 6 (six) hours as needed for headache or mild pain.  .  metoprolol succinate (TOPROL-XL) 25 MG 24 hr tablet Take 1 tablet (25 mg total) by mouth daily.  . Multiple Vitamins-Minerals (ONE-A-DAY WOMENS 50 PLUS) TABS Take 1 tablet by mouth daily with breakfast.  . nitroGLYCERIN (NITROSTAT) 0.4 MG SL tablet Place 1 tablet (0.4 mg total) under the tongue every 5 (five) minutes x 3 doses as needed for chest pain.  . Probiotic CAPS Take 1 capsule by mouth daily after breakfast.  . TURMERIC PO Take 1 capsule by mouth daily with breakfast.  . valsartan (DIOVAN) 40 MG tablet Take 1 tablet (40 mg total) by mouth daily.     Allergies  Allergen Reactions  . Beta Adrenergic Blockers Shortness Of Breath    Unnamed beta blocker (name not recalled by the patient) = shortness of breath  . Percodan [Oxycodone-Aspirin] Nausea And Vomiting  . Lisinopril Other (See Comments)    Patient does not recall the reaction  . Pork-Derived Products Other (See Comments)    Religious reasons = No pork  . Nickel Itching and Rash    Social History   Socioeconomic History  . Marital status: Married    Spouse name: Not on file  . Number of children: Not on file  . Years of education: Not on file  . Highest education level: Not on file  Occupational History  . Not on file  Tobacco Use  . Smoking status: Never Smoker  . Smokeless tobacco: Never Used  Substance and Sexual Activity  .  Alcohol use: Not on file  . Drug use: Not on file  . Sexual activity: Not on file  Other Topics Concern  . Not on file  Social History Narrative  . Not on file   Social Determinants of Health   Financial Resource Strain:   . Difficulty of Paying Living Expenses:   Food Insecurity:   . Worried About Programme researcher, broadcasting/film/video in the Last Year:   . Barista in the Last Year:   Transportation Needs:   . Freight forwarder (Medical):   Marland Kitchen Lack of Transportation (Non-Medical):   Physical Activity:   . Days of Exercise per Week:   . Minutes of Exercise per Session:   Stress:   .  Feeling of Stress :   Social Connections:   . Frequency of Communication with Friends and Family:   . Frequency of Social Gatherings with Friends and Family:   . Attends Religious Services:   . Active Member of Clubs or Organizations:   . Attends Banker Meetings:   Marland Kitchen Marital Status:   Intimate Partner Violence:   . Fear of Current or Ex-Partner:   . Emotionally Abused:   Marland Kitchen Physically Abused:   . Sexually Abused:      Review of Systems: General: negative for chills, fever, night sweats or weight changes.  Cardiovascular: negative for chest pain, dyspnea on exertion, edema, orthopnea, palpitations, paroxysmal nocturnal dyspnea or shortness of breath Dermatological: negative for rash Respiratory: negative for cough or wheezing Urologic: negative for hematuria Abdominal: negative for nausea, vomiting, diarrhea, bright red blood per rectum, melena, or hematemesis Neurologic: negative for visual changes, syncope, or dizziness All other systems reviewed and are otherwise negative except as noted above.    Blood pressure 126/80, pulse (!) 57, height 5\' 1"  (1.549 m), weight 134 lb (60.8 kg), SpO2 96 %.  General appearance: alert and no distress Neck: no adenopathy, no carotid bruit, no JVD, supple, symmetrical, trachea midline and thyroid not enlarged, symmetric, no tenderness/mass/nodules Lungs: clear to auscultation bilaterally Heart: regular rate and rhythm, S1, S2 normal, no murmur, click, rub or gallop Extremities: extremities normal, atraumatic, no cyanosis or edema Pulses: 2+ and symmetric Skin: Skin color, texture, turgor normal. No rashes or lesions Neurologic: Alert and oriented X 3, normal strength and tone. Normal symmetric reflexes. Normal coordination and gait  EKG sinus bradycardia 57 with incomplete right bundle branch block. I personally reviewed this EKG.  ASSESSMENT AND PLAN:   Essential hypertension History of essential hypertension blood pressure  measured today 126/80. She is on metoprolol and Diovan.  History of non-ST elevation myocardial infarction (NSTEMI) History of non-STEMI 02/24/2020, several days after taking her COVID-19 vaccine. Cardiac catheterization the following day via the right radial approach by Dr. 04/25/2020 revealed noncritical CAD with moderate LV dysfunction consistent with chronic Takotsubo syndrome. She has been on optimal medical therapy for this. She apparently had some recent chest pain which she discussed with Okey Dupre, PA-C recently relieved with sublingual nitroglycerin.  Takotsubo cardiomyopathy History of Takotsubo cardiomyopathy with recent echo performed 05/29/2020 revealing normal sensation of her LV function on metoprolol and Diovan.  Hyperlipidemia History of hyperlipidemia on statin therapy with lipid profile performed 03/22/2020 revealing total cholesterol 118, with an LDL of 58 and HDL 48.  Palpitations Recent palpitations at night. She does not drink caffeine. Will check a 2-week Zio patch.      05/22/2020 MD FACP,FACC,FAHA, [facility] 06/17/2020 3:56 PM

## 2020-06-17 NOTE — Assessment & Plan Note (Addendum)
History of non-STEMI 02/24/2020, several days after taking her COVID-19 vaccine. Cardiac catheterization the following day via the right radial approach by Dr. Okey Dupre revealed noncritical CAD with moderate LV dysfunction consistent with chronic Takotsubo syndrome. She has been on optimal medical therapy for this. She apparently had some recent chest pain which she discussed with Corine Shelter, PA-C recently relieved with sublingual nitroglycerin.

## 2020-06-17 NOTE — Assessment & Plan Note (Signed)
Recent palpitations at night. She does not drink caffeine. Will check a 2-week Zio patch.

## 2020-06-17 NOTE — Patient Instructions (Addendum)
Medication Instructions:  YOUR DOCTOR RECOMMEND YOU CONTINUE YOUR CURRENT MEDICATION. *If you need a refill on your cardiac medications before your next appointment, please call your pharmacy*   Lab Work: NONE If you have labs (blood work) drawn today and your tests are completely normal, you will receive your results only by: Marland Kitchen MyChart Message (if you have MyChart) OR . A paper copy in the mail If you have any lab test that is abnormal or we need to change your treatment, we will call you to review the results.   Testing/Procedures: Christena Deem- Long Term Monitor Instructions   Your physician has requested you wear your ZIO patch monitor___14___days.   This is a single patch monitor.  [employer] supplies one patch monitor per enrollment.  Additional stickers are not available.   Please do not apply patch if you will be having a Nuclear Stress Test, Echocardiogram, Cardiac CT, MRI, or Chest Xray during the time frame you would be wearing the monitor. The patch cannot be worn during these tests.  You cannot remove and re-apply the ZIO XT patch monitor.   Your ZIO patch monitor will be sent USPS Priority mail from [facility] directly to your home address. The monitor may also be mailed to a PO BOX if home delivery is not available.   It may take 3-5 days to receive your monitor after you have been enrolled.   Once you have received you monitor, please review enclosed instructions.  Your monitor has already been registered assigning a specific monitor serial # to you.   Applying the monitor   Shave hair from upper left chest.   Hold abrader disc by orange tab.  Rub abrader in 40 strokes over left upper chest as indicated in your monitor instructions.   Clean area with 4 enclosed alcohol pads .  Use all pads to assure are is cleaned thoroughly.  Let dry.   Apply patch as indicated in monitor instructions.  Patch will be place under collarbone on left side of chest with arrow pointing  upward.   Rub patch adhesive wings for 2 minutes.Remove white label marked "1".  Remove white label marked "2".  Rub patch adhesive wings for 2 additional minutes.   While looking in a mirror, press and release button in center of patch.  A small green light will flash 3-4 times .  This will be your only indicator the monitor has been turned on.     Do not shower for the first 24 hours.  You may shower after the first 24 hours.   Press button if you feel a symptom. You will hear a small click.  Record Date, Time and Symptom in the Patient Log Book.   When you are ready to remove patch, follow instructions on last 2 pages of Patient Log Book.  Stick patch monitor onto last page of Patient Log Book.   Place Patient Log Book in [place] box.  Use locking tab on box and tape box closed securely.  The [employer] and [employer] has [employer] on it.  Please place in mailbox as soon as possible.  Your physician should have your test results approximately 7 days after the monitor has been mailed back to [facility].   Call [facility] Customer Care at [phone] if you have questions regarding your ZIO XT patch monitor.  Call them immediately if you see an orange light blinking on your monitor.   If your monitor falls off in less than 4  days contact our Monitor department at [phone].  If your monitor becomes loose or falls off after 4 days call [employer] at [phone] for suggestions on securing your monitor.     Follow-Up: At [facility], you and your health needs are our priority.  As part of our continuing mission to provide you with exceptional heart care, we have created designated Provider Care Teams.  These Care Teams include your primary Cardiologist (physician) and Advanced Practice Providers (APPs -  Physician Assistants and Nurse Practitioners) who all work together to provide you with the care you need, when you need it.  We recommend signing up for the patient portal called  "MyChart".  Sign up information is provided on this After Visit Summary.  MyChart is used to connect with patients for Virtual Visits (Telemedicine).  Patients are able to view lab/test results, encounter notes, upcoming appointments, etc.  Non-urgent messages can be sent to your provider as well.   To learn more about what you can do with MyChart, go to [email or web address].    Your next appointment:   6 month(s) with Corine Shelter PA-C  12 month(s) with Dr. Allyson Sabal

## 2020-06-19 DIAGNOSIS — Z23 Encounter for immunization: Secondary | ICD-10-CM | POA: Diagnosis not present

## 2020-06-20 ENCOUNTER — Ambulatory Visit (INDEPENDENT_AMBULATORY_CARE_PROVIDER_SITE_OTHER): Payer: BC Managed Care – PPO

## 2020-06-20 DIAGNOSIS — R002 Palpitations: Secondary | ICD-10-CM | POA: Diagnosis not present

## 2020-07-20 DIAGNOSIS — R002 Palpitations: Secondary | ICD-10-CM | POA: Diagnosis not present

## 2020-07-21 ENCOUNTER — Ambulatory Visit: Payer: BC Managed Care – PPO | Admitting: General Practice

## 2020-07-21 NOTE — Progress Notes (Deleted)
Cardiology Clinic Note   Patient Name: Kayla [name] Date of Encounter: 07/21/2020  Primary Care Provider:  Maurice Small, MD Primary Cardiologist:  Nanetta Batty, MD  Patient Profile    Kayla [name] 64 year old female presents to the clinic today for follow-up evaluation of her palpitations.  Cardiac event monitor has not resulted.  Past Medical History    Past Medical History:  Diagnosis Date  . CAD (coronary artery disease), native coronary artery    a. cath 02/2020: 35%mLAD and 70% dig >> medical therapy   . Hyperlipidemia LDL goal <70   . Hypertension   . Stress-induced cardiomyopathy    Past Surgical History:  Procedure Laterality Date  . LEFT HEART CATH AND CORONARY ANGIOGRAPHY N/A 02/24/2020   Procedure: LEFT HEART CATH AND CORONARY ANGIOGRAPHY;  Surgeon: Yvonne Kendall, MD;  Location: [facility];  Service: Cardiovascular;  Laterality: N/A;    Allergies  Allergies  Allergen Reactions  . Beta Adrenergic Blockers Shortness Of Breath    Unnamed beta blocker (name not recalled by the patient) = shortness of breath  . Percodan [Oxycodone-Aspirin] Nausea And Vomiting  . Lisinopril Other (See Comments)    Patient does not recall the reaction  . Pork-Derived Products Other (See Comments)    Religious reasons = No pork  . Nickel Itching and Rash    History of Present Illness    She presented to the emergency department on 02/23/2020 with jaw pain and elevated troponins. Over the prior few days she has noticed increased DOE. She typically would walk 30 minutes daily but had noticed increased DOE. She contacted EMS and was transported to [facility] ED for further evaluation. Her systolic blood pressures were in the 170s. Her initial troponin was 372. EKG showed a RBBB but now ST or T wave deviation. She was given ASA and started on IV heparin. She underwent cardiac catheterization on 02/24/2020. It showed noncritical CAD, 70% ostial D2 stenosis, 20-40% mid LAD  stenosis on OM 2. LVEF 35-40% consistent with stress-induced cardiomyopathy. She also had significant vasospasm via right radial approach. Alternate approach was recommended if further cardiac catheterizations are needed. Echocardiogram 02/24/2020 showed an EF of 40-45%, G1 DD, findings consistent with Takotsubocardiomyopathy and no significant valvular abnormalities.  She presented the clinic 03/12/2020 for further evaluation and statedher blood pressures had been low  that mornings. 90s over 60s. She stated she felt better as the day went on and her blood pressure normalized. Her mother-in-law was living at her house and was increasing her stress. Just before she presented to the emergency department her mother-in-law fell and had a spine fracture. She was  observing Ramadan at the time of the visit and eating twice per day. Her mother-in-law was  in a rehab facility at the time of the visit. I encouraged her to continue to increase her physical activity slowly, eat a heart healthy diet, plan to add ARB in 3 weeks if blood pressure will allow. Follow-up planned for 3 weeks.  She presented to the clinic 04/03/2020 for follow-up evaluation and stated she had increased her physical activity to 30 minutes of walking daily.  She continued to follow a low-sodium diet.  Her mother-in-law had moved back into their house but was receiving home health 3 days a week for 3 hours at a time.  This  helped somewhat relieve stress.  I will give her 40 mg of valsartan daily, had her continue her physical activity, gave her the salty 6 information sheet,  and had her follow-up in 2 weeks.  She followed up with Dr. Allyson Sabal 06/17/2020 and had complaints of weakness, occasional atypical chest pain, and palpitations.  Her echocardiogram 05/29/2020 was reviewed and showed normal LV function with grade 1 diastolic dysfunction.  A 2-week ZIO monitor was ordered and showed***  Today shedenies chest pain, increased  shortness of breath, lower extremity edema, fatigue, palpitations, melena, hematuria, hemoptysis, diaphoresis, weakness, presyncope, syncope, orthopnea, and PND.  Home Medications    Prior to Admission medications   Medication Sig Start Date End Date Taking? Authorizing Provider  aspirin EC 81 MG tablet Take 81 mg by mouth as needed (for chest pain/sudden onset of "heart-related" symptoms).    [provider]  atorvastatin (LIPITOR) 40 MG tablet Take 1 tablet (40 mg total) by mouth daily at 6 PM. 02/25/20   Bhagat, [place], PA  Cholecalciferol (VITAMIN D3) 50 MCG (2000 UT) TABS Take 2,000 Units by mouth daily with breakfast.    [provider]  dimenhyDRINATE (DRAMAMINE) 50 MG tablet Take 25 mg by mouth at bedtime.    [provider]  ibuprofen (ADVIL) 200 MG tablet Take 200 mg by mouth every 6 (six) hours as needed for headache or mild pain.    [provider]  metoprolol succinate (TOPROL-XL) 25 MG 24 hr tablet Take 1 tablet (25 mg total) by mouth daily. 02/25/20   Bhagat, Sharrell Ku, PA  Multiple Vitamins-Minerals (ONE-A-DAY WOMENS 50 PLUS) TABS Take 1 tablet by mouth daily with breakfast.    [provider]  nitroGLYCERIN (NITROSTAT) 0.4 MG SL tablet Place 1 tablet (0.4 mg total) under the tongue every 5 (five) minutes x 3 doses as needed for chest pain. 02/25/20   Bhagat, Sharrell Ku, PA  Probiotic CAPS Take 1 capsule by mouth daily after breakfast.    [provider]  TURMERIC PO Take 1 capsule by mouth daily with breakfast.    [provider]  valsartan (DIOVAN) 40 MG tablet Take 1 tablet (40 mg total) by mouth daily. 04/03/20   Ronney Asters, NP    Family History    Family History  Problem Relation Age of Onset  . Heart disease Father    She indicated that her father is alive.  Social History    Social History   Socioeconomic History  . Marital status: Married    Spouse name: Not on file  . Number of  children: Not on file  . Years of education: Not on file  . Highest education level: Not on file  Occupational History  . Not on file  Tobacco Use  . Smoking status: Never Smoker  . Smokeless tobacco: Never Used  Substance and Sexual Activity  . Alcohol use: Not on file  . Drug use: Not on file  . Sexual activity: Not on file  Other Topics Concern  . Not on file  Social History Narrative  . Not on file   Social Determinants of Health   Financial Resource Strain:   . Difficulty of Paying Living Expenses: Not on file  Food Insecurity:   . Worried About Programme researcher, broadcasting/film/video in the Last Year: Not on file  . Ran Out of Food in the Last Year: Not on file  Transportation Needs:   . Lack of Transportation (Medical): Not on file  . Lack of Transportation (Non-Medical): Not on file  Physical Activity:   . Days of Exercise per Week: Not on file  . Minutes of Exercise per Session: Not on  file  Stress:   . Feeling of Stress : Not on file  Social Connections:   . Frequency of Communication with Friends and Family: Not on file  . Frequency of Social Gatherings with Friends and Family: Not on file  . Attends Religious Services: Not on file  . Active Member of Clubs or Organizations: Not on file  . Attends Banker Meetings: Not on file  . Marital Status: Not on file  Intimate Partner Violence:   . Fear of Current or Ex-Partner: Not on file  . Emotionally Abused: Not on file  . Physically Abused: Not on file  . Sexually Abused: Not on file     Review of Systems    General:  No chills, fever, night sweats or weight changes.  Cardiovascular:  No chest pain, dyspnea on exertion, edema, orthopnea, palpitations, paroxysmal nocturnal dyspnea. Dermatological: No rash, lesions/masses Respiratory: No cough, dyspnea Urologic: No hematuria, dysuria Abdominal:   No nausea, vomiting, diarrhea, bright red blood per rectum, melena, or hematemesis Neurologic:  No visual changes, wkns,  changes in mental status. All other systems reviewed and are otherwise negative except as noted above.  Physical Exam    VS:  LMP  (LMP Unknown)  , BMI There is no height or weight on file to calculate BMI. GEN: Well nourished, well developed, in no acute distress. HEENT: normal. Neck: Supple, no JVD, carotid bruits, or masses. Cardiac: RRR, no murmurs, rubs, or gallops. No clubbing, cyanosis, edema.  Radials/DP/PT 2+ and equal bilaterally.  Respiratory:  Respirations regular and unlabored, clear to auscultation bilaterally. GI: Soft, nontender, nondistended, BS + x 4. MS: no deformity or atrophy. Skin: warm and dry, no rash. Neuro:  Strength and sensation are intact. Psych: Normal affect.  Accessory Clinical Findings    Recent Labs: 02/25/2020: BUN 10; Creatinine, Ser 0.76; Hemoglobin 13.8; Platelets 315; Potassium 4.2; Sodium 138 04/22/2020: ALT 12   Recent Lipid Panel    Component Value Date/Time   CHOL 118 04/22/2020 0855   TRIG 89 04/22/2020 0855   HDL 43 04/22/2020 0855   CHOLHDL 2.7 04/22/2020 0855   CHOLHDL 3.2 02/24/2020 0106   VLDL 17 02/24/2020 0106   LDLCALC 58 04/22/2020 0855    ECG personally reviewed by me today- *** - No acute changes EKG 04/03/2020 Normal sinus rhythm incomplete right bundle branch block 62 bpm  Echocardiogram 02/24/2020 IMPRESSIONS    1. Left ventricular ejection fraction, by estimation, is 40 to 45%. Left  ventricular ejection fraction by 2D MOD biplane is 42.5 %. The left  ventricle has mildly decreased function. The left ventricle demonstrates  regional wall motion abnormalities (see  scoring diagram/findings for description). There is mild asymmetric left  ventricular hypertrophy of the septal segment. Left ventricular diastolic  parameters are consistent with Grade I diastolic dysfunction (impaired  relaxation). Hypokinesis of the mid  to apical segments with relative sparing of the basal segments.  Concerning for Takotsubo  cardiomyopathy . The average left ventricular  global longitudinal strain is -12.5 %.  2. Right ventricular systolic function is normal. The right ventricular  size is normal.  3. The mitral valve is normal in structure. No evidence of mitral valve  regurgitation. No evidence of mitral stenosis.  4. The aortic valve is tricuspid. Aortic valve regurgitation is not  visualized. No aortic stenosis is present.  5. The inferior vena cava is normal in size with greater than 50%  respiratory variability, suggesting right atrial pressure of 3 mmHg.  Cardiac catheterization 02/24/2020 1. Non-critical coronary artery disease, including 70% ostial D2 stenosis and 20-40% lesions involving the mid LAD on OM2. 2. Moderately reduced left ventricular systolic function with mid and apical anterior, apical, and apical inferior akinesis. LVEF 35-45%. Wall motion abnormality is most consistent with stress-induced cardiomyopathy. 3. Normal left ventricular filling pressure. 4. Significant right radial artery vasospasm affecting catheter selection/manipulation. Consider alternate approach if catheterization is needed in the future.  Recommendation: 1. Medical therapy to prevent progression of coronary artery disease, including statin and aspirin therapy. If patient has refractory symptoms with objective ischemia in D2 territory, PCI could be considered though intervention could jeopardize the LAD. 2. Optimize evidence-based heart failure therapy; metoprolol succinate should be continued, with addition of ARB tomorrow if renal function and blood pressure tolerate.  Diagnostic Dominance: Right  Intervention  Echocardiogram 05/29/2020 IMPRESSIONS    1. Left ventricular ejection fraction, by estimation, is 60 to 65%. The  left ventricle has normal function. The left ventricle has no regional  wall motion abnormalities. Left ventricular diastolic parameters are  consistent with Grade I diastolic    dysfunction (impaired relaxation).  2. Right ventricular systolic function is normal. The right ventricular  size is normal. There is normal pulmonary artery systolic pressure.  3. The mitral valve is normal in structure. Trivial mitral valve  regurgitation. No evidence of mitral stenosis.  4. The aortic valve is tricuspid. Aortic valve regurgitation is mild. No  aortic stenosis is present.  5. The inferior vena cava is normal in size with greater than 50%  respiratory variability, suggesting right atrial pressure of 3 mmHg.  Assessment & Plan   1.  Palpitations-no increased episodes of rapid heart rate or irregular beats.  Cardiac Zio monitor showed*** Continue*** Heart healthy low-sodium diet-salty 6 given Increase physical activity as tolerated   Stress-induced cardiomyopathy- no chest pain today. Continue current medical therapy Continue valsartan40mg  daily Heart healthy low-sodium diet Increase physical activity as tolerated  Essential hypertension-BP today130/60***.  Similar control at home. Continue current medical therapy Heart healthy low-sodium diet-salty 6 given Increase physical activity as tolerated  Hyperlipidemia-LDL 99 on 02/24/2020 Continue atorvastatin 40 mg Follow-up lipid panel and LFTs 6 weeks.  Disposition: Follow-up with  Dr. Allyson Sabal  or me in 3 months.   Thomasene Ripple. Dajae Kizer NP-C    07/21/2020, 6:17 AM [facility] [address] Office [phone] Fax [phone]  Notice: This dictation was prepared with Dragon dictation along with smaller phrase technology. Any transcriptional errors that result from this process are unintentional and may not be corrected upon review.

## 2020-08-06 NOTE — Progress Notes (Signed)
Virtual Visit via Telephone Note   This visit type was conducted due to national recommendations for restrictions regarding the COVID-19 Pandemic (e.g. social distancing) in an effort to limit this patient's exposure and mitigate transmission in our community.  Due to her co-morbid illnesses, this patient is at least at moderate risk for complications without adequate follow up.  This format is felt to be most appropriate for this patient at this time.  The patient did not have access to video technology/had technical difficulties with video requiring transitioning to audio format only (telephone).  All issues noted in this document were discussed and addressed.  No physical exam could be performed with this format.  Please refer to the patient's chart for her  consent to telehealth for [facility].  Evaluation Performed:  Follow-up visit  This visit type was conducted due to national recommendations for restrictions regarding the COVID-19 Pandemic (e.g. social distancing).  This format is felt to be most appropriate for this patient at this time.  All issues noted in this document were discussed and addressed.  No physical exam was performed (except for noted visual exam findings with Video Visits).  Please refer to the patient's chart (MyChart message for video visits and phone note for telephone visits) for the patient's consent to telehealth for [facility]  Date:  08/10/2020   ID:  Kayla [name], DOB [birth year], MRN [id number]  Patient Location:  [address] [place] [address]   Provider location:     [facility] [address] Office [phone] Fax [phone]   PCP:  Maurice Small, MD  Cardiologist:  Nanetta Batty, MD  Electrophysiologist:  None   Chief Complaint:   follow-up evaluation of her palpitations.  Cardiac event monitor has not resulted.  History of Present Illness:    Kayla [name] is a 64  y.o. female who presents via audio/video conferencing for a telehealth visit today.  Patient verified DOB and address.  She presented to the emergency department on 02/23/2020 with jaw pain and elevated troponins.  Over the prior few days she has noticed increased DOE.  She typically would walk 30 minutes daily but had noticed increased DOE.  She contacted EMS and was transported to [facility] ED for further evaluation.  Her systolic blood pressures were in the 170s.  Her initial troponin was 372.  EKG showed a RBBB but now ST or T wave deviation.  She was given ASA and started on IV heparin.  She underwent cardiac catheterization on 02/24/2020.  It showed noncritical CAD, 70% ostial D2 stenosis, 20-40% mid LAD stenosis on OM 2.  LVEF 35-40% consistent with stress-induced cardiomyopathy.  She also had significant vasospasm via right radial approach.  Alternate approach was recommended if further cardiac catheterizations are needed.  Echocardiogram 02/24/2020 showed an EF of 40-45%, G1 DD, findings consistent with Takotsubo cardiomyopathy and no significant valvular abnormalities.   She presented the clinic 03/12/2020 for further evaluation and stated her blood pressures had been low  that mornings.  90s over 60s.  She stated she felt better as the day went on and her blood pressure normalized.  Her mother-in-law was living at her house and was increasing her stress.  Just before she presented to the emergency department her mother-in-law fell and had a spine fracture.  She was  observing Ramadan at the time of the visit and eating twice per day.  Her mother-in-law was  in a rehab  facility at the time of the visit.  I encouraged her to continue to increase her physical activity slowly, eat a heart healthy diet, plan to add ARB in 3 weeks if blood pressure will allow.  Follow-up planned for 3 weeks.   She presented to the clinic 04/03/2020 for follow-up evaluation and stated she had increased her physical activity to 30  minutes of walking daily.  She continued to follow a low-sodium diet.  Her mother-in-law had moved back into their house but was receiving home health 3 days a week for 3 hours at a time.  This  helped somewhat relieve stress.  I will give her 40 mg of valsartan daily, had her continue her physical activity, gave her the salty 6 information sheet, and had her follow-up in 2 weeks.  She followed up with Dr. Allyson Sabal 06/17/2020 and had complaints of weakness, occasional atypical chest pain, and palpitations.  Her echocardiogram 05/29/2020 was reviewed and showed normal LV function with grade 1 diastolic dysfunction.  A 2-week ZIO monitor was ordered and showed sinus rhythm, sinus bradycardia, and occasional PVCs.  She is contacted virtually today and states that she continues to have occasional episodes of palpitations at rest.  She noticed these mainly in the evening.  She states that she continues to have some increased fatigue.  Her mother-in-law is now back living at their house full-time after being in rehab status post spinal fracture.  She feels that having her mother-in-law back in her home contributes to her stressed and possibly fatigue.  She continues to walk 30 minutes/day.  We reviewed her echocardiogram again from 7/21.  I reassured her that her fatigue is not related to irregular heart rhythms or the pumping function of her heart.  We will have her follow-up with Dr. Allyson Sabal or myself in 3 months.   Today she denies chest pain, increased shortness of breath, lower extremity edema, fatigue, palpitations, melena, hematuria, hemoptysis, diaphoresis, weakness, presyncope, syncope, orthopnea, and PND.  The patient does not symptoms concerning for COVID-19 infection (fever, chills, cough, or new SHORTNESS OF BREATH).    Prior CV studies:   The following studies were reviewed today:  Cardiac event monitor 07/21/2020 1 normal sinus rhythm/sinus bradycardia  2 occasional PVCs  EKG 04/03/2020 Normal  sinus rhythm incomplete right bundle branch block 62 bpm  Echocardiogram 02/24/2020 IMPRESSIONS     1. Left ventricular ejection fraction, by estimation, is 40 to 45%. Left  ventricular ejection fraction by 2D MOD biplane is 42.5 %. The left  ventricle has mildly decreased function. The left ventricle demonstrates  regional wall motion abnormalities (see   scoring diagram/findings for description). There is mild asymmetric left  ventricular hypertrophy of the septal segment. Left ventricular diastolic  parameters are consistent with Grade I diastolic dysfunction (impaired  relaxation). Hypokinesis of the mid   to apical segments with relative sparing of the basal segments.  Concerning for Takotsubo cardiomyopathy . The average left ventricular  global longitudinal strain is -12.5 %.   2. Right ventricular systolic function is normal. The right ventricular  size is normal.   3. The mitral valve is normal in structure. No evidence of mitral valve  regurgitation. No evidence of mitral stenosis.   4. The aortic valve is tricuspid. Aortic valve regurgitation is not  visualized. No aortic stenosis is present.   5. The inferior vena cava is normal in size with greater than 50%  respiratory variability, suggesting right atrial pressure of 3 mmHg.  Cardiac catheterization 02/24/2020 1. Non-critical coronary artery disease, including 70% ostial D2 stenosis and 20-40% lesions involving the mid LAD on OM2. 2. Moderately reduced left ventricular systolic function with mid and apical anterior, apical, and apical inferior akinesis.  LVEF 35-45%.  Wall motion abnormality is most consistent with stress-induced cardiomyopathy. 3. Normal left ventricular filling pressure. 4. Significant right radial artery vasospasm affecting catheter selection/manipulation.  Consider alternate approach if catheterization is needed in the future.   Recommendation: 1. Medical therapy to prevent progression of  coronary artery disease, including statin and aspirin therapy.  If patient has refractory symptoms with objective ischemia in D2 territory, PCI could be considered though intervention could jeopardize the LAD. 2. Optimize evidence-based heart failure therapy; metoprolol succinate should be continued, with addition of ARB tomorrow if renal function and blood pressure tolerate.   Diagnostic Dominance: Right  Intervention  Echocardiogram 05/29/2020 IMPRESSIONS     1. Left ventricular ejection fraction, by estimation, is 60 to 65%. The  left ventricle has normal function. The left ventricle has no regional  wall motion abnormalities. Left ventricular diastolic parameters are  consistent with Grade I diastolic  dysfunction (impaired relaxation).   2. Right ventricular systolic function is normal. The right ventricular  size is normal. There is normal pulmonary artery systolic pressure.   3. The mitral valve is normal in structure. Trivial mitral valve  regurgitation. No evidence of mitral stenosis.   4. The aortic valve is tricuspid. Aortic valve regurgitation is mild. No  aortic stenosis is present.   5. The inferior vena cava is normal in size with greater than 50%  respiratory variability, suggesting right atrial pressure of 3 mmHg.  Past Medical History:  Diagnosis Date   CAD (coronary artery disease), native coronary artery    a. cath 02/2020: 35%mLAD and 70% dig >> medical therapy    Hyperlipidemia LDL goal <70    Hypertension    Stress-induced cardiomyopathy    Past Surgical History:  Procedure Laterality Date   LEFT HEART CATH AND CORONARY ANGIOGRAPHY N/A 02/24/2020   Procedure: LEFT HEART CATH AND CORONARY ANGIOGRAPHY;  Surgeon: Yvonne Kendall, MD;  Location: [facility] INVASIVE CV LAB;  Service: Cardiovascular;  Laterality: N/A;     Current Meds  Medication Sig   aspirin EC 81 MG tablet Take 81 mg by mouth as needed (for chest pain/sudden onset of "heart-related"  symptoms).   atorvastatin (LIPITOR) 40 MG tablet Take 1 tablet (40 mg total) by mouth daily at 6 PM.   Cholecalciferol (VITAMIN D3) 50 MCG (2000 UT) TABS Take 2,000 Units by mouth daily with breakfast.   dimenhyDRINATE (DRAMAMINE) 50 MG tablet Take 25 mg by mouth at bedtime.   ibuprofen (ADVIL) 200 MG tablet Take 200 mg by mouth every 6 (six) hours as needed for headache or mild pain.   metoprolol succinate (TOPROL-XL) 25 MG 24 hr tablet Take 1 tablet (25 mg total) by mouth daily.   Multiple Vitamins-Minerals (ONE-A-DAY WOMENS 50 PLUS) TABS Take 1 tablet by mouth daily with breakfast.   nitroGLYCERIN (NITROSTAT) 0.4 MG SL tablet Place 1 tablet (0.4 mg total) under the tongue every 5 (five) minutes x 3 doses as needed for chest pain.   Probiotic CAPS Take 1 capsule by mouth daily after breakfast.   TURMERIC PO Take 1 capsule by mouth daily with breakfast.   valsartan (DIOVAN) 40 MG tablet Take 1 tablet (40 mg total) by mouth daily.     Allergies:   Beta adrenergic blockers, Percodan [oxycodone-aspirin],  Lisinopril, Pork-derived products, and Nickel   Social History   Tobacco Use   Smoking status: Never Smoker   Smokeless tobacco: Never Used  Substance Use Topics   Alcohol use: Not on file   Drug use: Not on file     Family Hx: The patient's family history includes Heart disease in her father.  ROS:   Please see the history of present illness.     All other systems reviewed and are negative.   Labs/Other Tests and Data Reviewed:    Recent Labs: 02/25/2020: BUN 10; Creatinine, Ser 0.76; Hemoglobin 13.8; Platelets 315; Potassium 4.2; Sodium 138 04/22/2020: ALT 12   Recent Lipid Panel Lab Results  Component Value Date/Time   CHOL 118 04/22/2020 08:55 AM   TRIG 89 04/22/2020 08:55 AM   HDL 43 04/22/2020 08:55 AM   CHOLHDL 2.7 04/22/2020 08:55 AM   CHOLHDL 3.2 02/24/2020 01:06 AM   LDLCALC 58 04/22/2020 08:55 AM    Wt Readings from Last 3 Encounters:  08/10/20  132 lb 9.6 oz (60.1 kg)  06/17/20 134 lb (60.8 kg)  05/26/20 136 lb (61.7 kg)     Exam:    Vital Signs:  BP 119/78    Pulse 65    Wt 132 lb 9.6 oz (60.1 kg)    LMP  (LMP Unknown)    BMI 25.05 kg/m    Well nourished, well developed female in no  acute distress.   ASSESSMENT & PLAN:    1.  Palpitations-no increased episodes of rapid heart rate or irregular beats however continues to notice palpitations in the evening.  Cardiac Zio monitor showed sinus rhythm, sinus bradycardia, occasional PVCs Continue metoprolol Avoid triggers caffeine, chocolate, EtOH etc.-Reviewed these triggers for palpitations.  She does not drink coffee, or alcohol.  She does use decaffeinated tea. Heart healthy low-sodium diet-salty 6 given Increase physical activity as tolerated Continue monitor  Stress-induced cardiomyopathy- no chest pain today.  States she does have some increased stress with her mother-in-law moving back into their home.  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 Continue current medical therapy Continue valsartan 40 mg  daily  Heart healthy low-sodium diet Increase physical activity as tolerated   Essential hypertension-BP today 119/78.  Similar control at home. Continue metoprolol, valsartan Heart healthy low-sodium diet-salty 6 given Increase physical activity as tolerated   Hyperlipidemia-LDL 99 on 02/24/2020 Continue atorvastatin 40 mg Follow-up lipid panel and LFTs 6 weeks.   Disposition: Follow-up with  Dr. Allyson Sabal  or me in 3 months.   COVID-19 Education: The signs and symptoms of COVID-19 were discussed with the patient and how to seek care for testing (follow up with PCP or arrange E-visit).  The importance of social distancing was discussed today.  Patient Risk:   After full review of this patients clinical status, I feel that they are at least moderate risk at this time.  Time:   Today,  I have spent 9 minutes with the patient with telehealth technology discussing palpitations, blood pressure, medications, echocardiogram, and exercise.   Medication Adjustments/Labs and Tests Ordered: Current medicines are reviewed at length with the patient today.  Concerns regarding medicines are outlined above.   Tests Ordered: No orders of the defined types were placed in this encounter.  Medication Changes: No orders of the defined types were placed in this encounter.   Disposition:  in 3 month(s)  Signed, Thomasene Ripple. Minal Stuller NP-C    06/18/2019 11:58 AM    [facility] [address] Office [phone] Fax [phone]

## 2020-08-10 ENCOUNTER — Encounter: Payer: Self-pay | Admitting: General Practice

## 2020-08-10 ENCOUNTER — Telehealth (INDEPENDENT_AMBULATORY_CARE_PROVIDER_SITE_OTHER): Payer: BC Managed Care – PPO | Admitting: General Practice

## 2020-08-10 VITALS — BP 119/78 | HR 65 | Wt 132.6 lb

## 2020-08-10 DIAGNOSIS — I5181 Takotsubo syndrome: Secondary | ICD-10-CM

## 2020-08-10 DIAGNOSIS — I10 Essential (primary) hypertension: Secondary | ICD-10-CM

## 2020-08-10 DIAGNOSIS — E782 Mixed hyperlipidemia: Secondary | ICD-10-CM | POA: Diagnosis not present

## 2020-08-10 DIAGNOSIS — R002 Palpitations: Secondary | ICD-10-CM | POA: Diagnosis not present

## 2020-08-10 NOTE — Patient Instructions (Signed)
Medication Instructions:  The current medical regimen is effective;  continue present plan and medications as directed. Please refer to the Current Medication list given to you today. *If you need a refill on your cardiac medications before your next appointment, please call your pharmacy*  Lab Work:   Testing/Procedures:  NONE    NONE  Special Instructions MAINTAIN YOUR HYDRATION  PLEASE INCREASE PHYSICAL ACTIVITY AS TOLERATED  Please try to avoid these triggers:  Do not use any products that have nicotine or tobacco in them. These include cigarettes, e-cigarettes, and chewing tobacco. If you need help quitting, ask your doctor.  Eat heart-healthy foods. Talk with your doctor about the right eating plan for you.  Exercise regularly as told by your doctor.  Do not drink alcohol, Caffeine or chocolate.  Lose weight if you are overweight.  Do not use drugs, including cannabis   Follow-Up: Your next appointment:  3 month(s) In Person with Nanetta Batty, MD Friday 11-27-2020@130PM  At [facility] you and your health needs are our priority.  As part of our continuing mission to provide you with exceptional heart care, we have created designated Provider Care Teams.  These Care Teams include your primary Cardiologist (physician) and Advanced Practice Providers (APPs -  Physician Assistants and Nurse Practitioners) who all work together to provide you with the care you need, when you need it.  We recommend signing up for the patient portal called "MyChart".  Sign up information is provided on this After Visit Summary.  MyChart is used to connect with patients for Virtual Visits (Telemedicine).  Patients are able to view lab/test results, encounter notes, upcoming appointments, etc.  Non-urgent messages can be sent to your provider as well.   To learn more about what you can do with MyChart, go to [email or web address].

## 2020-08-20 DIAGNOSIS — Z23 Encounter for immunization: Secondary | ICD-10-CM | POA: Diagnosis not present

## 2020-08-24 ENCOUNTER — Other Ambulatory Visit: Payer: Self-pay

## 2020-08-24 ENCOUNTER — Ambulatory Visit
Admission: RE | Admit: 2020-08-24 | Discharge: 2020-08-24 | Disposition: A | Discharge status: Home / Self Care | Payer: BC Managed Care – PPO | Source: Ambulatory Visit | Attending: Family Medicine | Admitting: Family Medicine

## 2020-08-24 DIAGNOSIS — Z1231 Encounter for screening mammogram for malignant neoplasm of breast: Secondary | ICD-10-CM | POA: Diagnosis not present

## 2020-08-24 DIAGNOSIS — M8580 Other specified disorders of bone density and structure, unspecified site: Secondary | ICD-10-CM

## 2020-08-24 DIAGNOSIS — M85852 Other specified disorders of bone density and structure, left thigh: Secondary | ICD-10-CM | POA: Diagnosis not present

## 2020-08-24 DIAGNOSIS — Z780 Asymptomatic menopausal state: Secondary | ICD-10-CM | POA: Diagnosis not present

## 2020-09-15 ENCOUNTER — Other Ambulatory Visit: Payer: Self-pay | Admitting: Physician Assistant

## 2020-09-20 ENCOUNTER — Other Ambulatory Visit: Payer: Self-pay | Admitting: General Practice

## 2020-11-25 ENCOUNTER — Ambulatory Visit: Payer: BC Managed Care – PPO | Admitting: Cardiovascular Disease

## 2020-11-27 ENCOUNTER — Other Ambulatory Visit: Payer: Self-pay

## 2020-11-27 ENCOUNTER — Encounter: Payer: Self-pay | Admitting: Cardiovascular Disease

## 2020-11-27 ENCOUNTER — Ambulatory Visit: Payer: 59 | Admitting: Cardiovascular Disease

## 2020-11-27 VITALS — BP 118/80 | HR 65 | Ht 61.0 in | Wt 130.0 lb

## 2020-11-27 DIAGNOSIS — I10 Essential (primary) hypertension: Secondary | ICD-10-CM | POA: Diagnosis not present

## 2020-11-27 DIAGNOSIS — E782 Mixed hyperlipidemia: Secondary | ICD-10-CM | POA: Diagnosis not present

## 2020-11-27 DIAGNOSIS — I252 Old myocardial infarction: Secondary | ICD-10-CM

## 2020-11-27 DIAGNOSIS — R002 Palpitations: Secondary | ICD-10-CM | POA: Diagnosis not present

## 2020-11-27 NOTE — Assessment & Plan Note (Signed)
Essential hypertension with blood pressure measured today at 118/80.  She is on metoprolol and valsartan.

## 2020-11-27 NOTE — Assessment & Plan Note (Signed)
History of non-STEMI 02/24/2020 2 days after receiving the COVID-vaccine.  She underwent cardiac catheterization the following day via the right radial approach by Dr. Okey Dupre revealing noncritical CAD with moderate LV dysfunction and involvement of motion abnormalities consistent with "Takotsubo syndrome".  She still gets occasional atypical noncardiac chest pain.

## 2020-11-27 NOTE — Assessment & Plan Note (Signed)
History of hyperlipidemia on statin therapy with lipid profile performed 04/22/2020 revealing total cholesterol 118, LDL 58 and HDL 43.

## 2020-11-27 NOTE — Progress Notes (Signed)
11/27/2020 Kayla [name]   1956-04-22  [id number]  Primary Physician Maurice Small, MD Primary Cardiologist: Runell Gess MD Nicholes Calamity, [place]  HPI:  Kayla [name] is a 65 y.o.  mildly overweight married Caucasian female mother of 2 children who I am seeing for the first time for post hospital follow-up. She has seen Kayla Fabian, FNP recently as well as Kayla Shelter, PA-C. I last saw her in the office 06/17/2020.Marland Kitchen She works as a Designer, multimedia for [employer] and has a degree in toxicology. She does have a history of hypertension hyperlipidemia as well as family history of heart disease with a father who had CABG in his 71s. She developed chest pain 2 days after receiving her Covid vaccine and was seen in the hospital by me on 02/24/2020. She underwent cardiac catheterization the following day via the right radial approach by Dr. Okey Dupre revealing noncritical CAD with moderate LV dysfunction and wall motion abnormalities consistent with "Takotsubo syndrome". She was discharged home on valsartan and metoprolol. She still complains of some fatigue and shortness of breath.  Recent 2D echocardiogram performed 05/29/2020 revealed normalization of LV function with grade 1 diastolic dysfunction. She still complains of weakness and occasional atypical chest pain as well as palpitations in the evening when she lies down. She does not drink caffeinated beverages.  Since I saw her in the office she did have an event monitor that showed occasional PVCs.  She gets occasional atypical chest pain as well.    No outpatient medications have been marked as taking for the 11/27/20 encounter (Office Visit) with Runell Gess, MD.     Allergies  Allergen Reactions  . Beta Adrenergic Blockers Shortness Of Breath    Unnamed beta blocker (name not recalled by the patient) = shortness of breath  . Percodan [Oxycodone-Aspirin] Nausea And Vomiting  . Lisinopril Other (See Comments)    Patient  does not recall the reaction  . Pork-Derived Products Other (See Comments)    Religious reasons = No pork  . Nickel Itching and Rash    Social History   Socioeconomic History  . Marital status: Married    Spouse name: Not on file  . Number of children: Not on file  . Years of education: Not on file  . Highest education level: Not on file  Occupational History  . Not on file  Tobacco Use  . Smoking status: Never Smoker  . Smokeless tobacco: Never Used  Substance and Sexual Activity  . Alcohol use: Not on file  . Drug use: Not on file  . Sexual activity: Not on file  Other Topics Concern  . Not on file  Social History Narrative  . Not on file   Social Determinants of Health   Financial Resource Strain: Not on file  Food Insecurity: Not on file  Transportation Needs: Not on file  Physical Activity: Not on file  Stress: Not on file  Social Connections: Not on file  Intimate Partner Violence: Not on file     Review of Systems: General: negative for chills, fever, night sweats or weight changes.  Cardiovascular: negative for chest pain, dyspnea on exertion, edema, orthopnea, palpitations, paroxysmal nocturnal dyspnea or shortness of breath Dermatological: negative for rash Respiratory: negative for cough or wheezing Urologic: negative for hematuria Abdominal: negative for nausea, vomiting, diarrhea, bright red blood per rectum, melena, or hematemesis Neurologic: negative for visual changes, syncope, or dizziness All other systems reviewed and are otherwise  negative except as noted above.    Blood pressure 118/80, pulse 65, height 5\' 1"  (1.549 m), weight 130 lb (59 kg).  General appearance: alert and no distress Neck: no adenopathy, no carotid bruit, no JVD, supple, symmetrical, trachea midline and thyroid not enlarged, symmetric, no tenderness/mass/nodules Lungs: clear to auscultation bilaterally Heart: regular rate and rhythm, S1, S2 normal, no murmur, click, rub or  gallop Extremities: extremities normal, atraumatic, no cyanosis or edema Pulses: 2+ and symmetric Skin: Skin color, texture, turgor normal. No rashes or lesions Neurologic: Alert and oriented X 3, normal strength and tone. Normal symmetric reflexes. Normal coordination and gait  EKG sinus rhythm at 65 with incomplete right bundle branch block.  I personally reviewed this EKG.  ASSESSMENT AND PLAN:   Essential hypertension Essential hypertension with blood pressure measured today at 118/80.  She is on metoprolol and valsartan.  History of non-ST elevation myocardial infarction (NSTEMI) History of non-STEMI 02/24/2020 2 days after receiving the COVID-vaccine.  She underwent cardiac catheterization the following day via the right radial approach by Dr. 04/25/2020 revealing noncritical CAD with moderate LV dysfunction and involvement of motion abnormalities consistent with "Takotsubo syndrome".  She still gets occasional atypical noncardiac chest pain.  Hyperlipidemia History of hyperlipidemia on statin therapy with lipid profile performed 04/22/2020 revealing total cholesterol 118, LDL 58 and HDL 43.  Palpitations History of palpitations with event monitoring performed 06/20/2020 revealing occasional PVCs.      08/20/2020 MD FACP,FACC,FAHA, [facility] 11/27/2020 1:42 PM

## 2020-11-27 NOTE — Patient Instructions (Signed)

## 2020-11-27 NOTE — Assessment & Plan Note (Signed)
History of palpitations with event monitoring performed 06/20/2020 revealing occasional PVCs.

## 2021-03-09 ENCOUNTER — Other Ambulatory Visit: Payer: Self-pay | Admitting: Cardiovascular Disease

## 2021-06-10 ENCOUNTER — Other Ambulatory Visit: Payer: Self-pay | Admitting: Cardiovascular Disease

## 2021-08-16 ENCOUNTER — Other Ambulatory Visit: Payer: Self-pay | Admitting: Family Medicine

## 2021-08-16 DIAGNOSIS — M5412 Radiculopathy, cervical region: Secondary | ICD-10-CM

## 2021-08-18 ENCOUNTER — Ambulatory Visit
Admission: RE | Admit: 2021-08-18 | Discharge: 2021-08-18 | Disposition: A | Discharge status: Home / Self Care | Payer: 59 | Source: Ambulatory Visit | Attending: Family Medicine | Admitting: Family Medicine

## 2021-08-18 DIAGNOSIS — M5412 Radiculopathy, cervical region: Secondary | ICD-10-CM

## 2021-10-08 IMAGING — CR DG CERVICAL SPINE COMPLETE 4+V
5 series · 5 of 5 positions shown · non-contrast
Comparison: 02/24/2020 CT

CLINICAL DATA: Neck pain and stiffness particularly on the left for
several years

EXAM:
CERVICAL SPINE - COMPLETE 4+ VIEW

[w cervical spine lat]
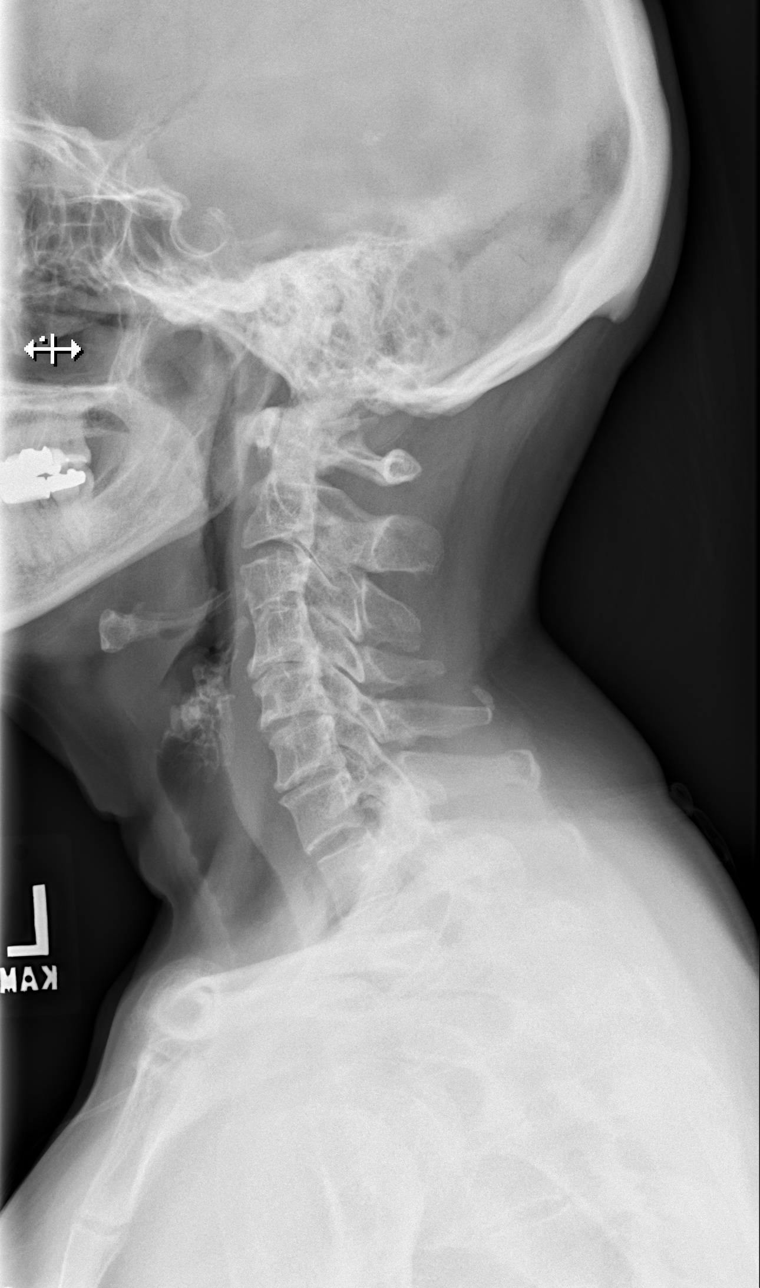

[w cervical spine ap_obl (1 of 2)]
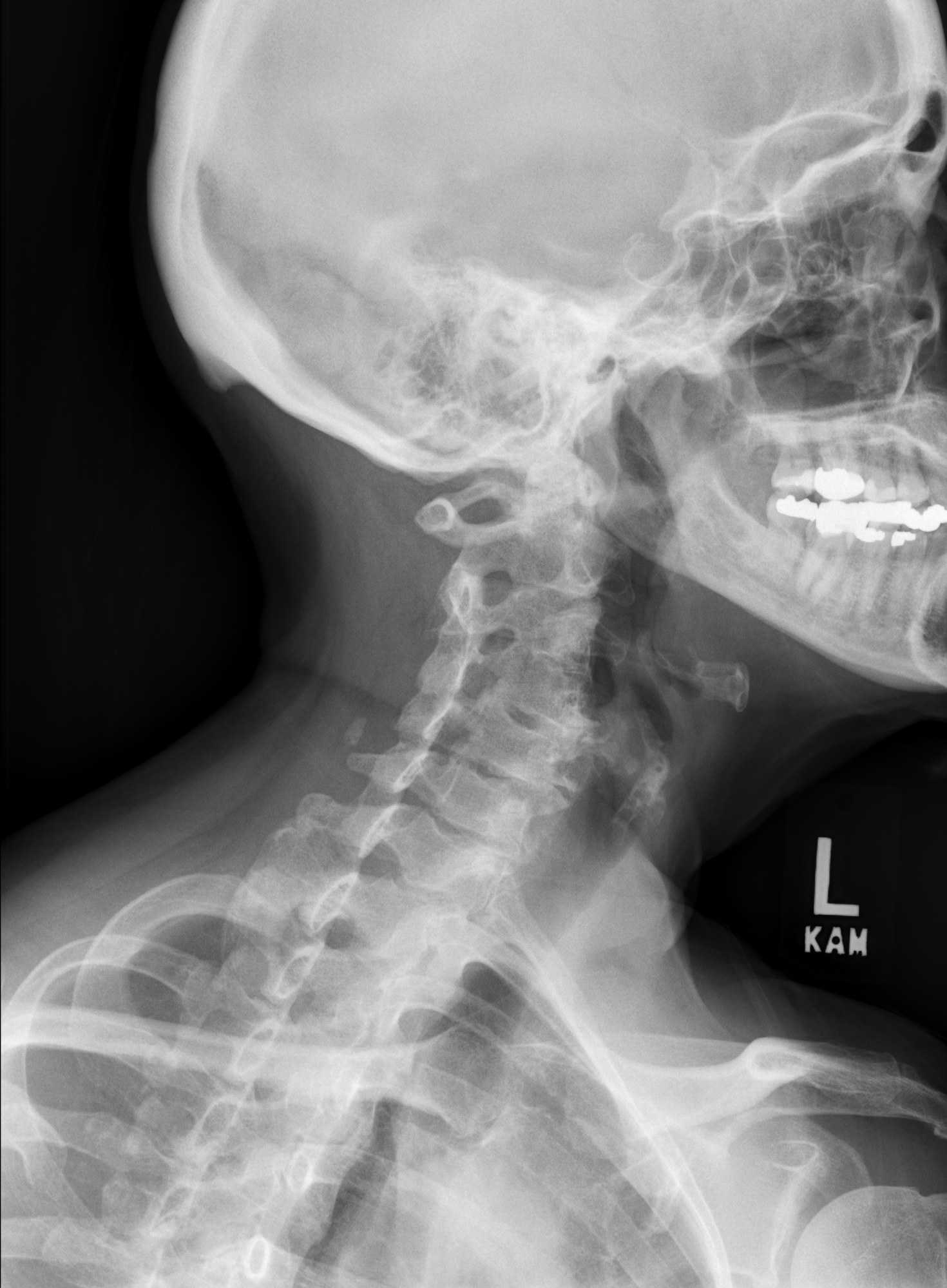

[w cervical spine ap_obl (2 of 2)]
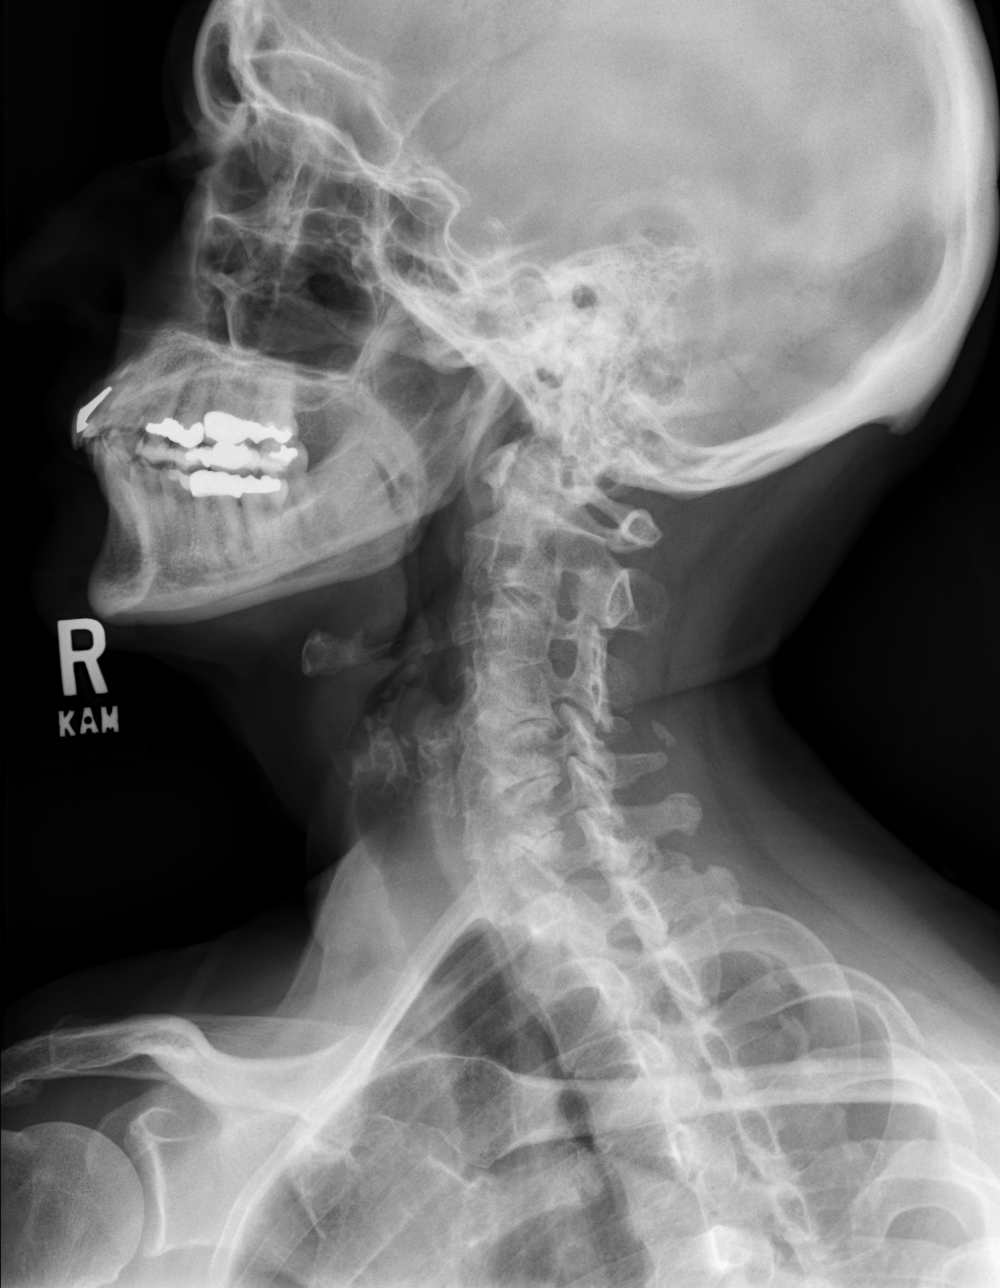

[w cervical spine ap]
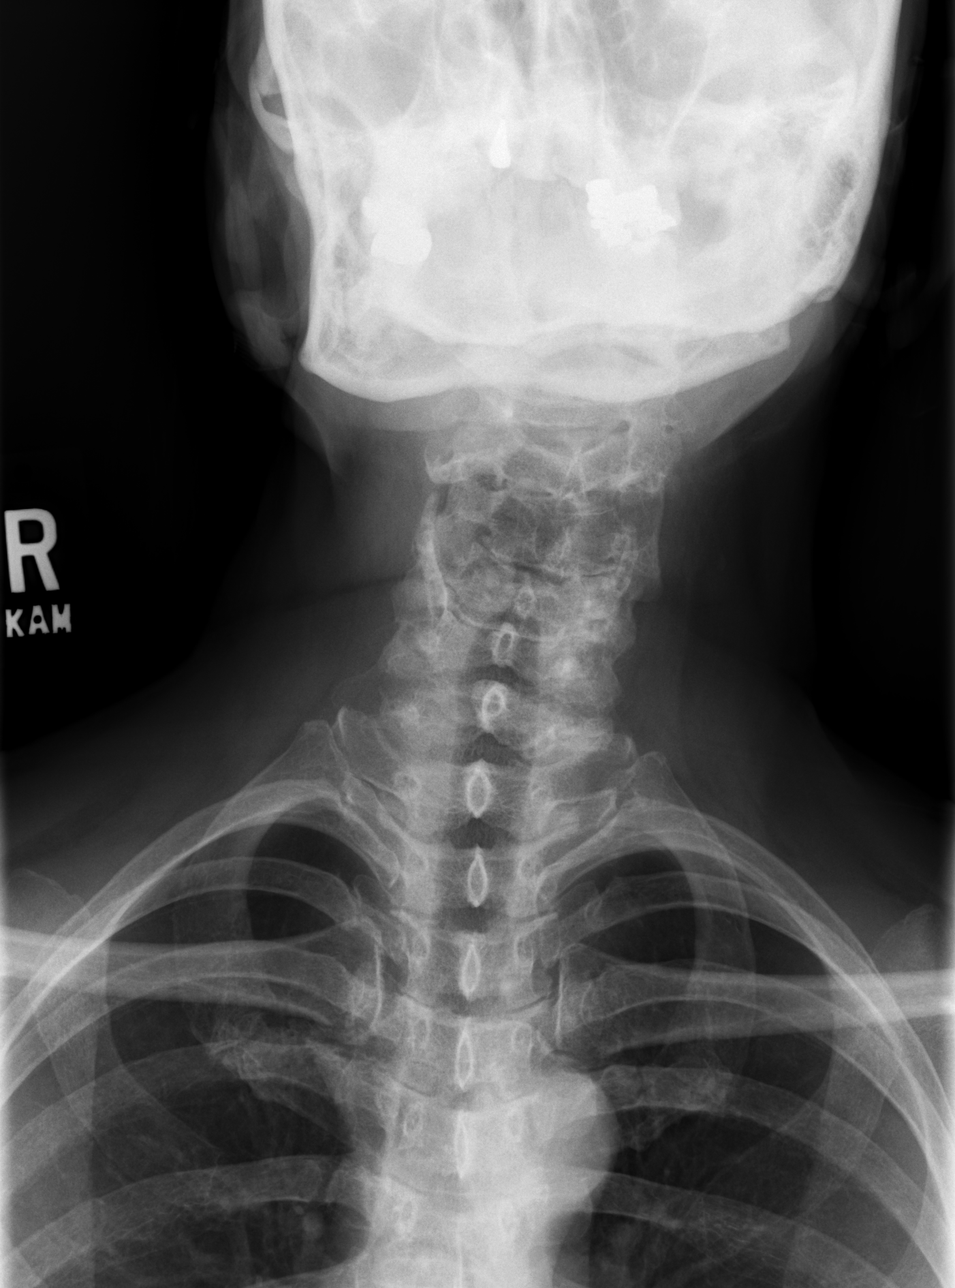

[w cervical spine odontoid]
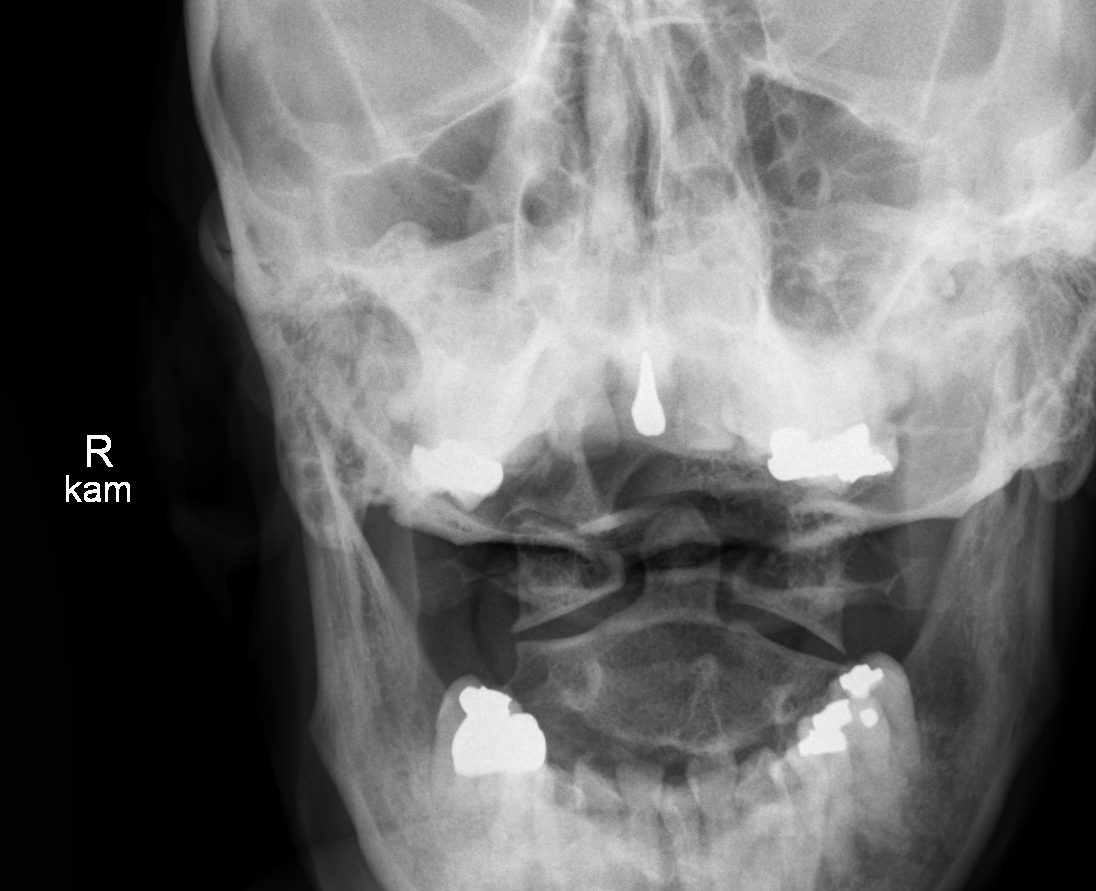

[5 of 5 positions shown; findings below may reference images not displayed]

FINDINGS: Seven cervical segments are well visualized. Partial fusion of the
vertebral bodies of C3 and C4 is noted stable from the prior CT
examination. Posterior fusion elements is noted at these levels.
Mild osteophytic changes are seen as well as disc space narrowing
throughout the cervical spine. No prevertebral soft tissue changes
are noted. Neural foraminal narrowing is noted left greater than
right particularly at C4-5 and C5-6. The odontoid is within normal
limits.
IMPRESSION: Degenerative change as described with neural foraminal narrowing
worse on the left than the right.

## 2022-02-26 ENCOUNTER — Other Ambulatory Visit: Payer: Self-pay | Admitting: Cardiovascular Disease

## 2022-03-19 ENCOUNTER — Other Ambulatory Visit: Payer: Self-pay | Admitting: Cardiovascular Disease

## 2022-05-04 ENCOUNTER — Other Ambulatory Visit: Payer: Self-pay | Admitting: Physician Assistant

## 2022-05-30 ENCOUNTER — Other Ambulatory Visit: Payer: Self-pay | Admitting: Cardiovascular Disease

## 2022-08-08 ENCOUNTER — Other Ambulatory Visit: Payer: Self-pay | Admitting: Cardiovascular Disease

## 2022-08-29 ENCOUNTER — Other Ambulatory Visit: Payer: Self-pay | Admitting: Cardiovascular Disease

## 2022-09-04 ENCOUNTER — Other Ambulatory Visit: Payer: Self-pay | Admitting: Cardiovascular Disease

## 2022-09-15 ENCOUNTER — Other Ambulatory Visit: Payer: Self-pay

## 2022-09-15 MED ORDER — METOPROLOL SUCCINATE ER 25 MG PO TB24: 25.0000 mg | ORAL_TABLET | Freq: Every day | ORAL | 0 refills | Status: DC

## 2022-10-07 ENCOUNTER — Other Ambulatory Visit: Payer: Self-pay | Admitting: Cardiovascular Disease

## 2022-10-10 ENCOUNTER — Telehealth: Payer: Self-pay | Admitting: Cardiovascular Disease

## 2022-10-10 NOTE — Telephone Encounter (Signed)
*  STAT* If patient is at the pharmacy, call can be transferred to refill team.   1. Which medications need to be refilled? (please list name of each medication and dose if known)  atorvastatin (LIPITOR) 40 MG tablet  2. Which pharmacy/location (including street and city if local pharmacy) is medication to be sent to? [id number] - [place], [place] - [address]  3. Do they need a 30 day or 90 day supply?  90 day supply  Patient states she has been completely out of medication since 11/23.

## 2022-10-11 MED ORDER — METOPROLOL SUCCINATE ER 25 MG PO TB24: 25.0000 mg | ORAL_TABLET | Freq: Every day | ORAL | 0 refills | Status: DC

## 2022-11-02 ENCOUNTER — Other Ambulatory Visit: Payer: Self-pay | Admitting: Cardiovascular Disease

## 2022-11-05 ENCOUNTER — Other Ambulatory Visit: Payer: Self-pay | Admitting: Cardiovascular Disease

## 2022-11-08 ENCOUNTER — Other Ambulatory Visit: Payer: Self-pay | Admitting: Cardiovascular Disease

## 2022-12-11 ENCOUNTER — Other Ambulatory Visit: Payer: Self-pay | Admitting: Cardiovascular Disease

## 2022-12-14 ENCOUNTER — Ambulatory Visit: Payer: 59 | Attending: Cardiovascular Disease | Admitting: Cardiovascular Disease

## 2022-12-14 ENCOUNTER — Encounter: Payer: Self-pay | Admitting: Cardiovascular Disease

## 2022-12-14 VITALS — BP 148/80 | HR 58 | Ht 59.0 in | Wt 121.2 lb

## 2022-12-14 DIAGNOSIS — E782 Mixed hyperlipidemia: Secondary | ICD-10-CM

## 2022-12-14 DIAGNOSIS — I10 Essential (primary) hypertension: Secondary | ICD-10-CM

## 2022-12-14 DIAGNOSIS — I5181 Takotsubo syndrome: Secondary | ICD-10-CM | POA: Diagnosis not present

## 2022-12-14 MED ORDER — ATORVASTATIN CALCIUM 40 MG PO TABS: 40.0000 mg | ORAL_TABLET | Freq: Every day | ORAL | 3 refills | Status: DC

## 2022-12-14 MED ORDER — NITROGLYCERIN 0.4 MG SL SUBL: 0.4000 mg | SUBLINGUAL_TABLET | SUBLINGUAL | 3 refills | Status: AC | PRN

## 2022-12-14 MED ORDER — METOPROLOL SUCCINATE ER 25 MG PO TB24: 25.0000 mg | ORAL_TABLET | Freq: Every day | ORAL | 3 refills | Status: DC

## 2022-12-14 MED ORDER — VALSARTAN 40 MG PO TABS: 40.0000 mg | ORAL_TABLET | Freq: Every day | ORAL | 3 refills | Status: AC

## 2022-12-14 NOTE — Assessment & Plan Note (Signed)
History of essential hypertension with blood pressure measured today at 148/80.  She is on metoprolol and valsartan.

## 2022-12-14 NOTE — Patient Instructions (Signed)
Medication Instructions:  Your physician recommends that you continue on your current medications as directed. Please refer to the Current Medication list given to you today.  *If you need a refill on your cardiac medications before your next appointment, please call your pharmacy*   Follow-Up: At [facility], you and your health needs are our priority.  As part of our continuing mission to provide you with exceptional heart care, we have created designated Provider Care Teams.  These Care Teams include your primary Cardiologist (physician) and Advanced Practice Providers (APPs -  Physician Assistants and Nurse Practitioners) who all work together to provide you with the care you need, when you need it.  We recommend signing up for the patient portal called "MyChart".  Sign up information is provided on this After Visit Summary.  MyChart is used to connect with patients for Virtual Visits (Telemedicine).  Patients are able to view lab/test results, encounter notes, upcoming appointments, etc.  Non-urgent messages can be sent to your provider as well.   To learn more about what you can do with MyChart, go to [email or web address].    Your next appointment:   We will see you on an as needed basis.  Provider:   Jonathan Berry, MD  

## 2022-12-14 NOTE — Assessment & Plan Note (Signed)
History of hyperlipidemia on statin therapy with lipid profile performed 06/01/2022 revealing total cholesterol 141, LDL 60 and HDL 61.

## 2022-12-14 NOTE — Assessment & Plan Note (Signed)
History of non-STEMI status post cardiac catheterization performed by Dr. 02/24/2020 revealing clean coronary arteries and "Takotsubo syndrome.  Subsequent 2D echo did show normalization of LV function.  She is otherwise asymptomatic.

## 2022-12-14 NOTE — Progress Notes (Unsigned)
12/14/2022 Kayla [name]   1955-11-15  [id number]  Primary Physician Kelton Pillar, MD Primary Cardiologist: Lorretta Harp MD FACP, [place], [place], [place]  HPI:  Kayla [name] is a 67 y.o.   mildly overweight married Caucasian female mother of 2 children who I last saw in the office 11/27/2020.  She works as a Airline pilot for [employer] and has a degree in toxicology.  She does have a history of hypertension hyperlipidemia as well as family history of heart disease with a father who had CABG in his 22s.  She developed chest pain 2 days after receiving her Covid vaccine and was seen in the hospital by me on 02/24/2020.  She underwent cardiac catheterization the following day via the right radial approach by Dr. Saunders Revel revealing noncritical CAD with moderate LV dysfunction and wall motion abnormalities consistent with "Takotsubo syndrome".  She was discharged home on valsartan and metoprolol.  She still complains of some fatigue and shortness of breath.   Recent 2D echocardiogram performed 05/29/2020 revealed normalization of LV function with grade 1 diastolic dysfunction. She still complains of weakness and occasional atypical chest pain as well as palpitations in the evening when she lies down. She does not drink caffeinated beverages.   Since I saw her in the office 2 years ago she has done well.  She did wear an event monitor that showed occasional PVCs.  She also has multiple sclerosis.  Current Meds  Medication Sig   aspirin EC 81 MG tablet Take 81 mg by mouth as needed (for chest pain/sudden onset of "heart-related" symptoms).   Cholecalciferol (VITAMIN D3) 50 MCG (2000 UT) TABS Take 2,000 Units by mouth daily with breakfast.   dimenhyDRINATE (DRAMAMINE) 50 MG tablet Take 25 mg by mouth at bedtime.   ibuprofen (ADVIL) 200 MG tablet Take 200 mg by mouth every 6 (six) hours as needed for headache or mild pain.   Multiple Vitamins-Minerals (ONE-A-DAY WOMENS 50 PLUS) TABS Take 1 tablet by  mouth daily with breakfast.   Probiotic CAPS Take 1 capsule by mouth daily after breakfast.   TURMERIC PO Take 1 capsule by mouth daily with breakfast.   [DISCONTINUED] atorvastatin (LIPITOR) 40 MG tablet Take 1 tablet (40 mg total) by mouth daily.   [DISCONTINUED] metoprolol succinate (TOPROL-XL) 25 MG 24 hr tablet Take 1 tablet (25 mg total) by mouth daily. Please contact the office to schedule appointment for additional refills. 4th Attempt.   [DISCONTINUED] nitroGLYCERIN (NITROSTAT) 0.4 MG SL tablet PLACE 1 TABLET UNDER THE TONGUE EVERY 5 MINUTES X3 DOSES AS NEEDED FOR CHEST PAIN   [DISCONTINUED] valsartan (DIOVAN) 40 MG tablet TAKE 1 TABLET BY MOUTH EVERY DAY     Allergies  Allergen Reactions   Beta Adrenergic Blockers Shortness Of Breath    Unnamed beta blocker (name not recalled by the patient) = shortness of breath   Percodan [Oxycodone-Aspirin] Nausea And Vomiting   Lisinopril Other (See Comments)    Patient does not recall the reaction   Pork-Derived Products Other (See Comments)    Religious reasons = No pork   Nickel Itching and Rash    Social History   Socioeconomic History   Marital status: Married    Spouse name: Not on file   Number of children: Not on file   Years of education: Not on file   Highest education level: Not on file  Occupational History   Not on file  Tobacco Use   Smoking status: Never  Smokeless tobacco: Never  Substance and Sexual Activity   Alcohol use: Not on file   Drug use: Not on file   Sexual activity: Not on file  Other Topics Concern   Not on file  Social History Narrative   Not on file   Social Determinants of Health   Financial Resource Strain: Not on file  Food Insecurity: Not on file  Transportation Needs: Not on file  Physical Activity: Not on file  Stress: Not on file  Social Connections: Not on file  Intimate Partner Violence: Not on file     Review of Systems: General: negative for chills, fever, night sweats or  weight changes.  Cardiovascular: negative for chest pain, dyspnea on exertion, edema, orthopnea, palpitations, paroxysmal nocturnal dyspnea or shortness of breath Dermatological: negative for rash Respiratory: negative for cough or wheezing Urologic: negative for hematuria Abdominal: negative for nausea, vomiting, diarrhea, bright red blood per rectum, melena, or hematemesis Neurologic: negative for visual changes, syncope, or dizziness All other systems reviewed and are otherwise negative except as noted above.    Blood pressure (!) 148/80, pulse (!) 58, height 4\' 11"  (1.499 m), weight 121 lb 3.2 oz (55 kg), SpO2 98 %.  General appearance: alert and no distress Neck: no adenopathy, no carotid bruit, no JVD, supple, symmetrical, trachea midline, and thyroid not enlarged, symmetric, no tenderness/mass/nodules Lungs: clear to auscultation bilaterally Heart: regular rate and rhythm, S1, S2 normal, no murmur, click, rub or gallop Extremities: extremities normal, atraumatic, no cyanosis or edema Pulses: 2+ and symmetric Skin: Skin color, texture, turgor normal. No rashes or lesions Neurologic: Grossly normal  EKG sinus bradycardia 58 with incomplete right bundle branch block.  I personally reviewed this EKG.  ASSESSMENT AND PLAN:   Essential hypertension History of essential hypertension with blood pressure measured today at 148/80.  She is on metoprolol and valsartan.  Takotsubo cardiomyopathy History of non-STEMI status post cardiac catheterization performed by Dr. 02/24/2020 revealing clean coronary arteries and "Takotsubo syndrome.  Subsequent 2D echo did show normalization of LV function.  She is otherwise asymptomatic.  Hyperlipidemia History of hyperlipidemia on statin therapy with lipid profile performed 06/01/2022 revealing total cholesterol 141, LDL 60 and HDL 61.     Lorretta Harp MD FACP,FACC,FAHA, [facility] 12/14/2022 3:41 PM

## 2023-06-08 ENCOUNTER — Other Ambulatory Visit: Payer: Self-pay | Admitting: Internal Medicine

## 2023-06-08 DIAGNOSIS — Z1231 Encounter for screening mammogram for malignant neoplasm of breast: Secondary | ICD-10-CM

## 2023-06-08 DIAGNOSIS — Z13820 Encounter for screening for osteoporosis: Secondary | ICD-10-CM

## 2023-12-11 ENCOUNTER — Other Ambulatory Visit: Payer: Self-pay | Admitting: Cardiovascular Disease

## 2023-12-21 ENCOUNTER — Ambulatory Visit
Admission: RE | Admit: 2023-12-21 | Discharge: 2023-12-21 | Disposition: A | Discharge status: Home / Self Care | Payer: 59 | Source: Ambulatory Visit | Attending: Internal Medicine | Admitting: Internal Medicine

## 2023-12-21 DIAGNOSIS — Z13820 Encounter for screening for osteoporosis: Secondary | ICD-10-CM

## 2023-12-21 DIAGNOSIS — Z1231 Encounter for screening mammogram for malignant neoplasm of breast: Secondary | ICD-10-CM

## 2024-02-12 ENCOUNTER — Other Ambulatory Visit: Payer: Self-pay | Admitting: Cardiovascular Disease

## 2024-04-07 ENCOUNTER — Other Ambulatory Visit: Payer: Self-pay | Admitting: Cardiovascular Disease

## 2024-10-07 ENCOUNTER — Emergency Department (HOSPITAL_COMMUNITY)
Admission: EM | Admit: 2024-10-07 | Discharge: 2024-10-07 | Discharge status: Against Medical Advice | Attending: Emergency Medicine | Admitting: Emergency Medicine

## 2024-10-07 DIAGNOSIS — Z5321 Procedure and treatment not carried out due to patient leaving prior to being seen by health care provider: Secondary | ICD-10-CM | POA: Diagnosis not present

## 2024-10-07 DIAGNOSIS — R238 Other skin changes: Secondary | ICD-10-CM | POA: Diagnosis present

## 2024-10-07 DIAGNOSIS — R0602 Shortness of breath: Secondary | ICD-10-CM | POA: Insufficient documentation

## 2024-10-07 NOTE — ED Notes (Signed)
 Pt states that her hands has intermittently turning purple and white. Pt also endorses that the left of her neck is also blue. This RN wiped the side of her neck with an alcohol wipe and the blue color came off of her skin. Pt stated she was leaving since it appears to be dye.

## 2024-10-07 NOTE — ED Triage Notes (Signed)
 Patient hands are turning purple intermittently and she also has purplish blue on the left side of her neck. She reports she is having shob, denies chest pain.
